# Patient Record
Sex: Female | Born: 1965 | Race: White | Hispanic: No | Smoking: Never smoker
Health system: Southern US, Community
[De-identification: ages and names within clinical notes are randomized; demographics above are authoritative.]

## PROBLEM LIST (undated history)

## (undated) DIAGNOSIS — I82629 Acute embolism and thrombosis of deep veins of unspecified upper extremity: Secondary | ICD-10-CM

## (undated) DIAGNOSIS — I10 Essential (primary) hypertension: Secondary | ICD-10-CM

## (undated) DIAGNOSIS — D219 Benign neoplasm of connective and other soft tissue, unspecified: Secondary | ICD-10-CM

## (undated) DIAGNOSIS — U071 COVID-19: Secondary | ICD-10-CM

## (undated) DIAGNOSIS — M1990 Unspecified osteoarthritis, unspecified site: Secondary | ICD-10-CM

## (undated) HISTORY — DX: Unspecified osteoarthritis, unspecified site: M19.90

## (undated) HISTORY — PX: MYOMECTOMY: SHX85

## (undated) HISTORY — DX: Benign neoplasm of connective and other soft tissue, unspecified: D21.9

---

## 2005-07-28 ENCOUNTER — Emergency Department: Payer: Self-pay | Admitting: Emergency Medicine

## 2005-09-10 ENCOUNTER — Emergency Department: Payer: Self-pay | Admitting: Unknown Physician Specialty

## 2006-03-21 ENCOUNTER — Emergency Department: Payer: Self-pay | Admitting: Emergency Medicine

## 2006-08-22 ENCOUNTER — Emergency Department: Payer: Self-pay | Admitting: Emergency Medicine

## 2006-09-22 ENCOUNTER — Emergency Department: Payer: Self-pay | Admitting: Emergency Medicine

## 2006-10-15 ENCOUNTER — Ambulatory Visit (HOSPITAL_COMMUNITY): Admission: RE | Admit: 2006-10-15 | Discharge: 2006-10-15 | Payer: Self-pay | Admitting: Specialist

## 2006-10-22 ENCOUNTER — Ambulatory Visit: Payer: Self-pay | Admitting: Pain Medicine

## 2006-11-01 ENCOUNTER — Ambulatory Visit: Payer: Self-pay | Admitting: Pain Medicine

## 2006-11-20 ENCOUNTER — Ambulatory Visit: Payer: Self-pay | Admitting: Physician Assistant

## 2006-12-04 ENCOUNTER — Ambulatory Visit: Payer: Self-pay | Admitting: Physician Assistant

## 2007-12-08 ENCOUNTER — Emergency Department: Payer: Self-pay | Admitting: Internal Medicine

## 2007-12-23 ENCOUNTER — Emergency Department: Payer: Self-pay | Admitting: Emergency Medicine

## 2008-01-29 ENCOUNTER — Emergency Department: Payer: Self-pay | Admitting: Internal Medicine

## 2008-02-02 ENCOUNTER — Emergency Department: Payer: Self-pay | Admitting: Emergency Medicine

## 2008-02-03 ENCOUNTER — Emergency Department: Payer: Self-pay | Admitting: Emergency Medicine

## 2008-02-07 ENCOUNTER — Emergency Department: Payer: Self-pay | Admitting: Emergency Medicine

## 2008-04-27 ENCOUNTER — Emergency Department: Payer: Self-pay | Admitting: Emergency Medicine

## 2008-05-06 ENCOUNTER — Emergency Department: Payer: Self-pay | Admitting: Unknown Physician Specialty

## 2008-08-21 ENCOUNTER — Emergency Department: Payer: Self-pay | Admitting: Emergency Medicine

## 2008-12-12 ENCOUNTER — Emergency Department: Payer: Self-pay | Admitting: Emergency Medicine

## 2008-12-14 ENCOUNTER — Emergency Department: Payer: Self-pay | Admitting: Emergency Medicine

## 2009-10-23 ENCOUNTER — Emergency Department: Payer: Self-pay | Admitting: Emergency Medicine

## 2010-01-05 ENCOUNTER — Emergency Department: Payer: Self-pay | Admitting: Emergency Medicine

## 2010-03-21 ENCOUNTER — Emergency Department: Payer: Self-pay | Admitting: Emergency Medicine

## 2010-08-13 IMAGING — CR DG ELBOW COMPLETE 3+V*L*
1 series · 4 of 4 positions shown · non-contrast
Comparison: none

REASON FOR EXAM: pain
COMMENTS:

PROCEDURE:     DXR - DXR ELBOW LT COMP W/OBLIQUES  - February 02, 2008 [DATE]
RESULT:     No acute bony or joint abnormality is identified.  Densities are
noted over the soft tissues about the olecranon.  These could represent
foreign bodies.  Soft tissue swelling is noted in this region.

[Series 1: view not recorded · 0.17mm/px · 4 of 4 slices shown]
[im 1/4]
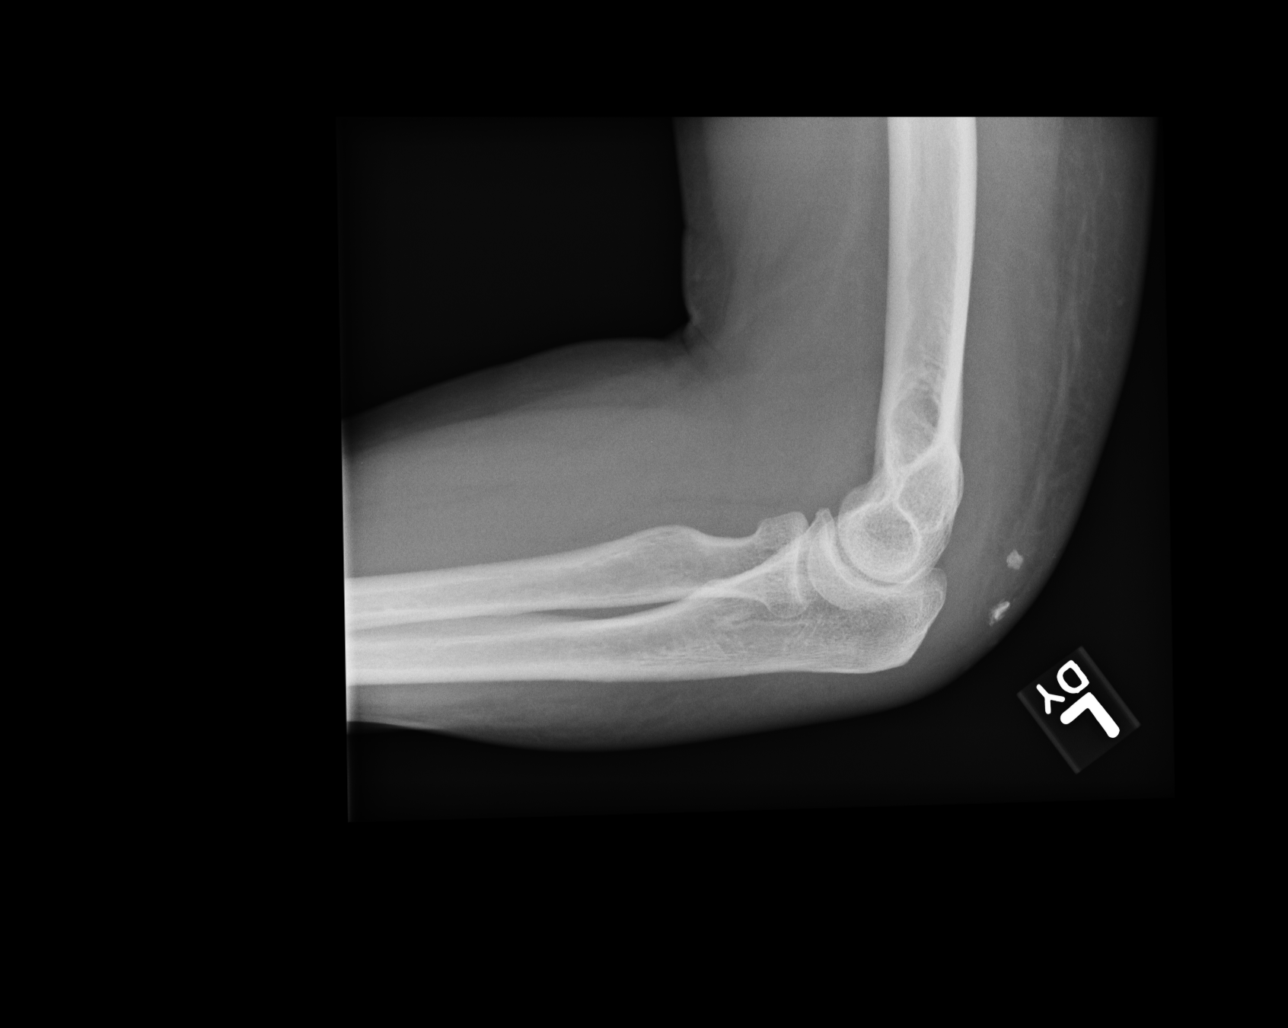
[im 2/4]
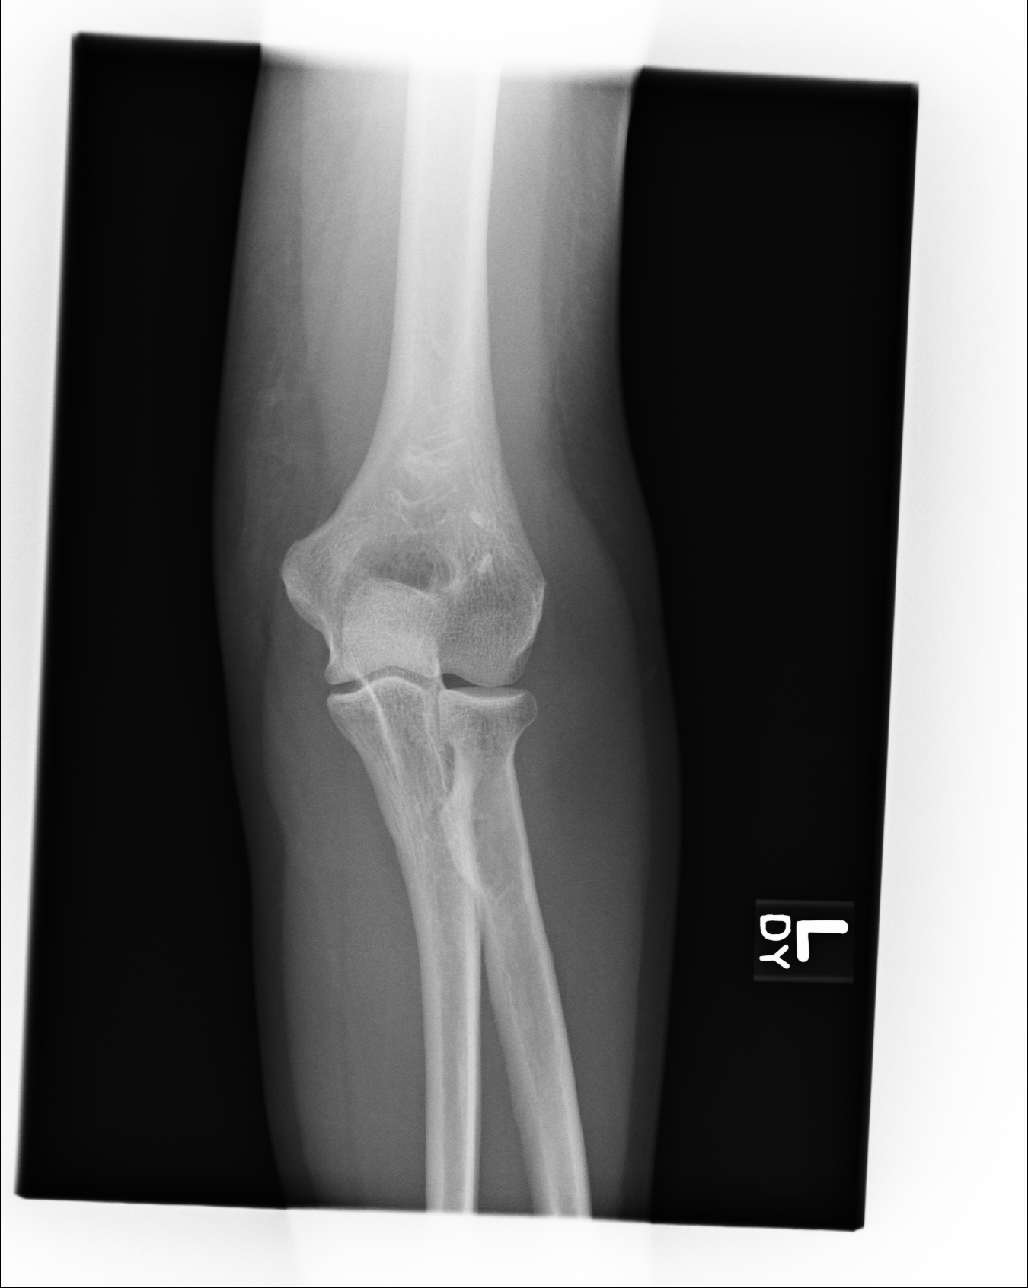
[im 3/4]
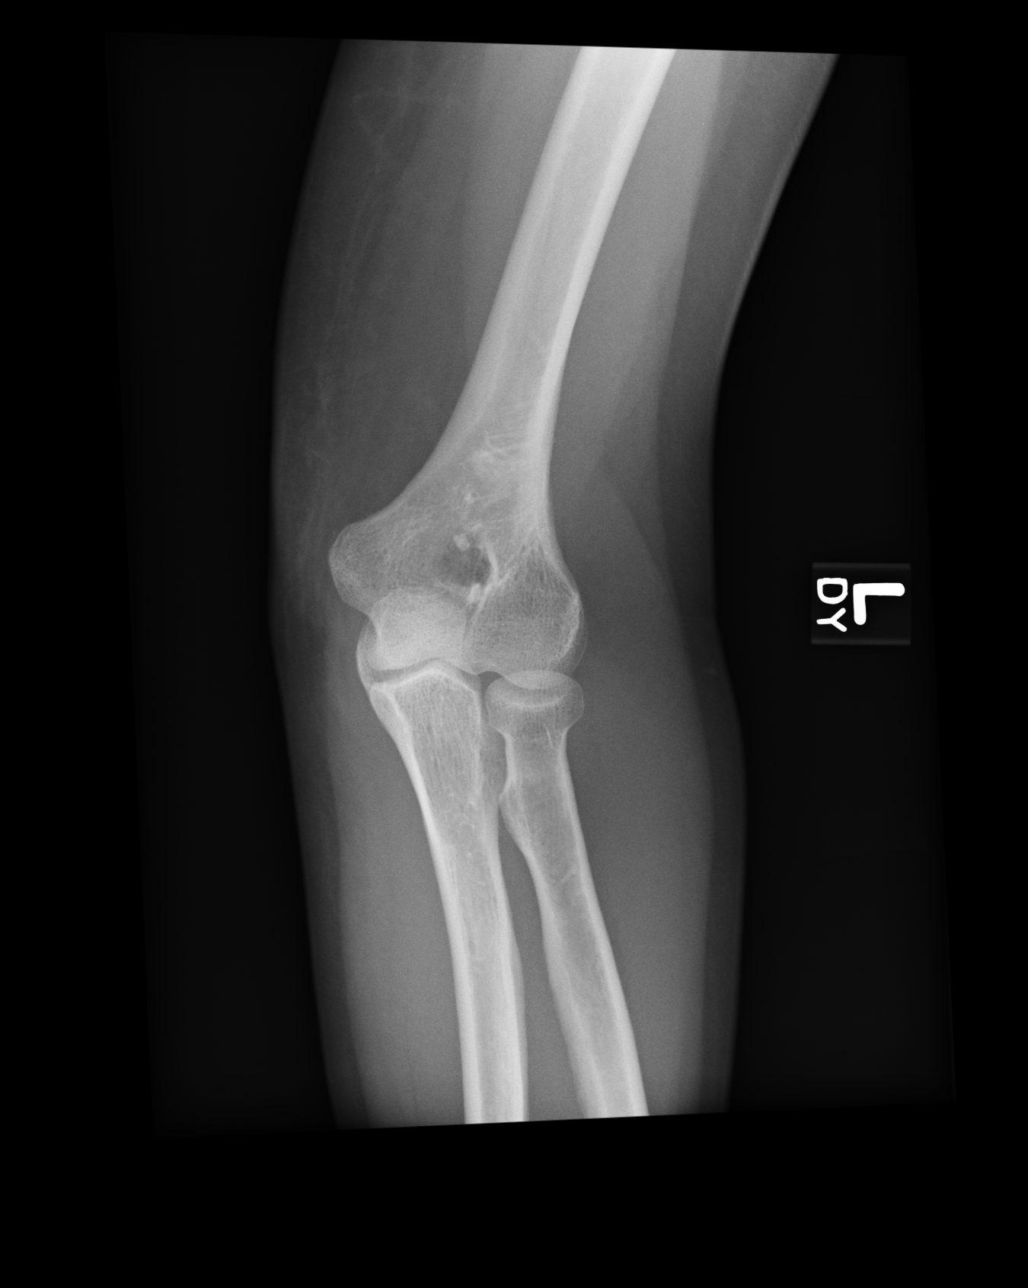
[im 4/4]
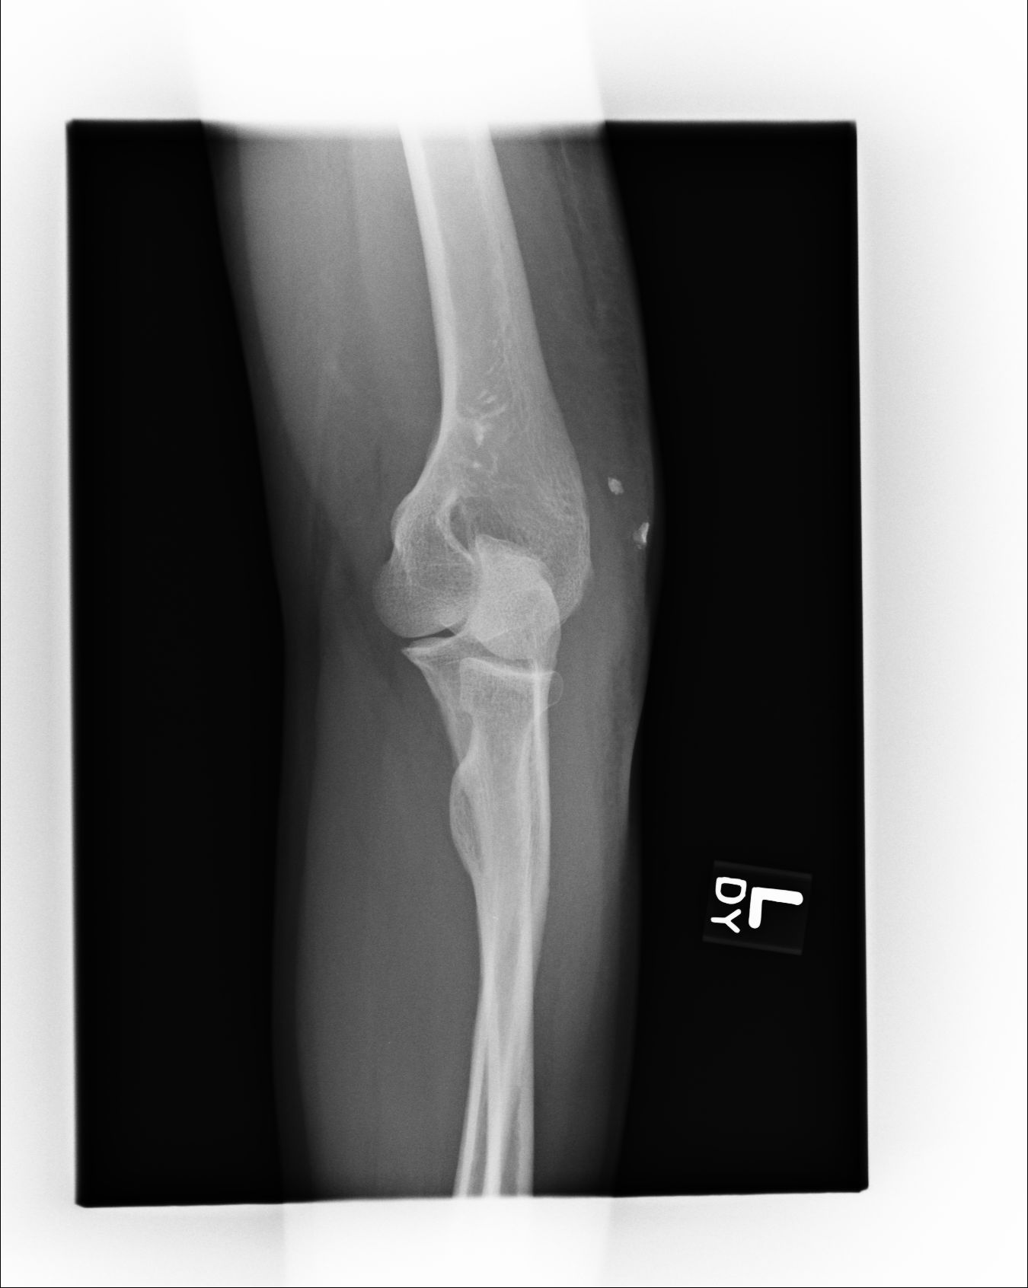

[4 of 4 positions shown; findings below may reference images not displayed]

IMPRESSION: Findings consistent with soft tissue swelling and foreign bodies over the
soft tissues of the olecranon.  No fracture is noted.

## 2010-08-23 ENCOUNTER — Emergency Department: Payer: Self-pay | Admitting: Emergency Medicine

## 2011-02-08 ENCOUNTER — Emergency Department: Payer: Self-pay | Admitting: Emergency Medicine

## 2011-07-07 ENCOUNTER — Emergency Department: Payer: Self-pay | Admitting: Emergency Medicine

## 2011-07-07 LAB — URINALYSIS, COMPLETE
Glucose,UR: NEGATIVE mg/dL (ref 0–75)
Ph: 6 (ref 4.5–8.0)
Protein: NEGATIVE
RBC,UR: 139 /HPF (ref 0–5)
Specific Gravity: 1.015 (ref 1.003–1.030)
Squamous Epithelial: 5
Transitional Epi: 1
WBC UR: 373 /HPF (ref 0–5)

## 2011-12-13 ENCOUNTER — Emergency Department: Payer: Self-pay | Admitting: Internal Medicine

## 2011-12-13 ENCOUNTER — Emergency Department: Payer: Self-pay | Admitting: Emergency Medicine

## 2011-12-13 LAB — CBC
HCT: 36 % (ref 35.0–47.0)
HGB: 11.7 g/dL — ABNORMAL LOW (ref 12.0–16.0)
MCH: 24.9 pg — ABNORMAL LOW (ref 26.0–34.0)
MCV: 76 fL — ABNORMAL LOW (ref 80–100)
Platelet: 419 10*3/uL (ref 150–440)

## 2011-12-13 LAB — COMPREHENSIVE METABOLIC PANEL
Albumin: 3.8 g/dL (ref 3.4–5.0)
Alkaline Phosphatase: 80 U/L (ref 50–136)
Anion Gap: 4 — ABNORMAL LOW (ref 7–16)
BUN: 14 mg/dL (ref 7–18)
Creatinine: 0.7 mg/dL (ref 0.60–1.30)
Glucose: 94 mg/dL (ref 65–99)
Osmolality: 280 (ref 275–301)
Potassium: 3.8 mmol/L (ref 3.5–5.1)
Sodium: 140 mmol/L (ref 136–145)
Total Protein: 6.9 g/dL (ref 6.4–8.2)

## 2011-12-13 LAB — URINALYSIS, COMPLETE
Glucose,UR: NEGATIVE mg/dL (ref 0–75)
Ketone: NEGATIVE
Nitrite: NEGATIVE
RBC,UR: 4 /HPF (ref 0–5)
Specific Gravity: 1.028 (ref 1.003–1.030)
WBC UR: 12 /HPF (ref 0–5)

## 2012-02-28 ENCOUNTER — Emergency Department: Payer: Self-pay | Admitting: Unknown Physician Specialty

## 2012-02-28 LAB — URINALYSIS, COMPLETE
Bilirubin,UR: NEGATIVE
Blood: NEGATIVE
Glucose,UR: NEGATIVE mg/dL (ref 0–75)
Ketone: NEGATIVE
Nitrite: NEGATIVE
WBC UR: 186 /HPF (ref 0–5)

## 2013-12-01 ENCOUNTER — Emergency Department: Payer: Self-pay | Admitting: Emergency Medicine

## 2014-03-30 ENCOUNTER — Emergency Department: Payer: Self-pay | Admitting: Emergency Medicine

## 2014-08-25 ENCOUNTER — Encounter: Payer: Self-pay | Admitting: Emergency Medicine

## 2014-08-25 ENCOUNTER — Emergency Department
Admission: EM | Admit: 2014-08-25 | Discharge: 2014-08-25 | Disposition: A | Discharge status: Home / Self Care | Payer: Self-pay | Attending: Emergency Medicine | Admitting: Emergency Medicine

## 2014-08-25 DIAGNOSIS — N390 Urinary tract infection, site not specified: Secondary | ICD-10-CM | POA: Insufficient documentation

## 2014-08-25 LAB — COMPREHENSIVE METABOLIC PANEL
ALT: 17 U/L (ref 14–54)
AST: 18 U/L (ref 15–41)
Albumin: 4.2 g/dL (ref 3.5–5.0)
Alkaline Phosphatase: 59 U/L (ref 38–126)
Anion gap: 10 (ref 5–15)
BUN: 16 mg/dL (ref 6–20)
CO2: 25 mmol/L (ref 22–32)
Calcium: 9.1 mg/dL (ref 8.9–10.3)
Chloride: 104 mmol/L (ref 101–111)
Creatinine, Ser: 0.79 mg/dL (ref 0.44–1.00)
GFR calc Af Amer: >60 mL/min (ref >60)
GFR calc non Af Amer: >60 mL/min (ref >60)
Glucose, Bld: 119 mg/dL — ABNORMAL HIGH (ref 65–99)
Potassium: 3.6 mmol/L (ref 3.5–5.1)
Sodium: 139 mmol/L (ref 135–145)
Total Bilirubin: 0.1 mg/dL — ABNORMAL LOW (ref 0.3–1.2)
Total Protein: 7.6 g/dL (ref 6.5–8.1)

## 2014-08-25 LAB — CBC
HCT: 40 % (ref 35.0–47.0)
HEMOGLOBIN: 12.5 g/dL (ref 12.0–16.0)
MCH: 23.8 pg — AB (ref 26.0–34.0)
MCHC: 31.2 g/dL — AB (ref 32.0–36.0)
MCV: 76.3 fL — ABNORMAL LOW (ref 80.0–100.0)
PLATELETS: 459 10*3/uL — AB (ref 150–440)
RBC: 5.24 MIL/uL — ABNORMAL HIGH (ref 3.80–5.20)
RDW: 15.2 % — AB (ref 11.5–14.5)
WBC: 10.1 10*3/uL (ref 3.6–11.0)

## 2014-08-25 LAB — URINALYSIS COMPLETE WITH MICROSCOPIC (ARMC ONLY)
Bilirubin Urine: NEGATIVE
Glucose, UA: NEGATIVE mg/dL
Hgb urine dipstick: NEGATIVE
Ketones, ur: NEGATIVE mg/dL
Nitrite: NEGATIVE
Protein, ur: 30 mg/dL — AB
Specific Gravity, Urine: 1.025 (ref 1.005–1.030)
pH: 5 (ref 5.0–8.0)

## 2014-08-25 LAB — LIPASE, BLOOD: Lipase: 26 U/L (ref 22–51)

## 2014-08-25 MED ORDER — ONDANSETRON 4 MG PO TBDP: 4.0000 mg | ORAL_TABLET | Freq: Four times a day (QID) | ORAL | Status: DC | PRN

## 2014-08-25 MED ORDER — IBUPROFEN 800 MG PO TABS
800.0000 mg | ORAL_TABLET | ORAL | Status: AC
  Administered 2014-08-25: 800 mg via ORAL
  Filled 2014-08-25: qty 1

## 2014-08-25 MED ORDER — ONDANSETRON HCL 4 MG/2ML IJ SOLN
4.0000 mg | INTRAMUSCULAR | Status: AC
  Administered 2014-08-25: 4 mg via INTRAVENOUS
  Filled 2014-08-25: qty 2

## 2014-08-25 MED ORDER — CEPHALEXIN 500 MG PO CAPS: 500.0000 mg | ORAL_CAPSULE | Freq: Two times a day (BID) | ORAL | Status: DC

## 2014-08-25 MED ORDER — SODIUM CHLORIDE 0.9 % IV BOLUS (SEPSIS)
1000.0000 mL | Freq: Once | INTRAVENOUS | Status: AC
  Administered 2014-08-25: 1000 mL via INTRAVENOUS

## 2014-08-25 MED ORDER — DEXTROSE 5 % IV SOLN
1.0000 g | Freq: Once | INTRAVENOUS | Status: AC
  Administered 2014-08-25: 1 g via INTRAVENOUS
  Filled 2014-08-25: qty 10

## 2014-08-25 NOTE — ED Provider Notes (Signed)
[facility] Emergency Department Provider Note  ____________________________________________  Time seen: Approximately 9:52 AM  I have reviewed the triage vital signs and the nursing notes.   HISTORY  Chief Complaint Abdominal Pain and Nausea    HPI Christine Chase is a 49 y.o. female who is fairly healthy, but reports that she's had some nausea, some chills, and abdominal pain which she describes as a crampy pain and throwing up twice yesterday. She reports that for the last week she's felt generally just tired and slightly weak.Occasional headaches off and on for about a week as well.  She states she is able to eat breakfast this morning, her abdominal pain has gone away but she still feels slightly nauseated and generally fatigued and tired. Slight increase in frequency of urination, and she reports she has had a couple of urinary tract infections in the last year most recently treated with ciprofloxacin roughly 6 months ago.   History reviewed. No pertinent past medical history.  There are no active problems to display for this patient.   History reviewed. No pertinent past surgical history.  Current Outpatient Rx  Name  Route  Sig  Dispense  Refill  . cephALEXin (KEFLEX) 500 MG capsule   Oral   Take 1 capsule (500 mg total) by mouth 2 (two) times daily.   20 capsule   0   . ondansetron (ZOFRAN ODT) 4 MG disintegrating tablet   Oral   Take 1 tablet (4 mg total) by mouth every 6 (six) hours as needed for nausea or vomiting.   20 tablet   0     Allergies Review of patient's allergies indicates no known allergies.  History reviewed. No pertinent family history.  Social History Social History  Substance Use Topics  . Smoking status: Never Smoker   . Smokeless tobacco: None  . Alcohol Use: No    Review of Systems Constitutional: No fever, some chills Eyes: No visual changes. ENT: No sore throat. Cardiovascular: Denies chest  pain. Respiratory: Denies shortness of breath. Gastrointestinal: See history of present illness  No diarrhea.  No constipation. Genitourinary: Negative for dysuria. Some frequency. Musculoskeletal: Negative for back pain. Skin: Negative for rash. Neurological: Negative for  focal weakness or numbness. No tick bites. No rash.  10-point ROS otherwise negative.  ____________________________________________   PHYSICAL EXAM:  VITAL SIGNS: ED Triage Vitals  Enc Vitals Group     BP 08/25/14 0800 163/94 mmHg     Pulse Rate 08/25/14 0800 96     Resp 08/25/14 0800 20     Temp 08/25/14 0800 97.7 F (36.5 C)     Temp Source 08/25/14 0800 Oral     SpO2 08/25/14 0800 100 %     Weight 08/25/14 0800 165 lb (74.844 kg)     Height 08/25/14 0800 5\' 5"  (1.651 m)     Head Cir --      Peak Flow --      Pain Score 08/25/14 0801 6     Pain Loc --      Pain Edu? --      Excl. in [place]? --     Constitutional: Alert and oriented. Well appearing and in no acute distress. Very pleasant and amicable. Eyes: Conjunctivae are normal. PERRL. EOMI. Head: Atraumatic. Nose: No congestion/rhinnorhea. Mouth/Throat: Mucous membranes are moist.  Oropharynx non-erythematous. Neck: No stridor.   Cardiovascular: Normal rate, regular rhythm. Grossly normal heart sounds.  Good peripheral circulation. Respiratory: Normal respiratory effort.  No retractions.  Lungs CTAB. Gastrointestinal: Soft and nontender except for slight discomfort in the suprapubic region. No distention. No abdominal bruits. No CVA tenderness. No pain at McBurney's point. No rebound or guarding. Musculoskeletal: No lower extremity tenderness nor edema.  No joint effusions. Neurologic:  Normal speech and language. No gross focal neurologic deficits are appreciated. Skin:  Skin is warm, dry and intact. No rash noted. Psychiatric: Mood and affect are normal. Speech and behavior are normal.  ____________________________________________   LABS (all  labs ordered are listed, but only abnormal results are displayed)  Labs Reviewed  COMPREHENSIVE METABOLIC PANEL - Abnormal; Notable for the following:    Glucose, Bld 119 (*)    Total Bilirubin 0.1 (*)    All other components within normal limits  CBC - Abnormal; Notable for the following:    RBC 5.24 (*)    MCV 76.3 (*)    MCH 23.8 (*)    MCHC 31.2 (*)    RDW 15.2 (*)    Platelets 459 (*)    All other components within normal limits  URINALYSIS COMPLETEWITH MICROSCOPIC ([facility] ONLY) - Abnormal; Notable for the following:    Color, Urine YELLOW (*)    APPearance CLOUDY (*)    Protein, ur 30 (*)    Leukocytes, UA 3+ (*)    Bacteria, UA MANY (*)    Squamous Epithelial / LPF 6-30 (*)    All other components within normal limits  URINE CULTURE  LIPASE, BLOOD   ____________________________________________  EKG   ____________________________________________  RADIOLOGY   ____________________________________________   PROCEDURES  Procedure(s) performed: None  Critical Care performed: No  ____________________________________________   INITIAL IMPRESSION / ASSESSMENT AND PLAN / ED COURSE  Pertinent labs & imaging results that were available during my care of the patient were reviewed by me and considered in my medical decision making (see chart for details).  And symptoms of generalized illness, with some slight frequency and a urinalysis that is highly suggestive of UTI. Very reassuring exam. Afebrile with a normal white count. No risk factors for systemic or severe infection. Appears consistent with urinary tract infection, I will hydrate her and initiated her with ceftriaxone and continue her on Keflex for the next week. She has primary care follow-up R reestablish for Tuesday of this week, and she will follow up then.   Return precautions including any right lower abdominal pain, fever after 24 hours of antibiotic's, worsening symptoms, inability to keep food down,  feeling dehydrated, severe pain or other concerns discussed with the patient who is agreeable. ____________________________________________   FINAL CLINICAL IMPRESSION(S) / ED DIAGNOSES  Final diagnoses:  Acute urinary tract infection      Delman Kitten, MD 08/28/14 [phone]

## 2014-08-25 NOTE — Discharge Instructions (Signed)
You have been seen in the Emergency Department (ED) today for pain when urinating.  Your workup today suggests that you have a urinary tract infection (UTI).  Please take your antibiotic as prescribed and over-the-counter pain medication (Tylenol or Motrin) as needed, but no more than recommended on the label instructions.  Drink PLENTY of fluids.  Call your regular doctor to schedule the next available appointment to follow up on todays ED visit, or return immediately to the ED if your pain worsens, you have decreased urine production, develop fever, persistent vomiting, or other symptoms that concern you.   Urinary Tract Infection Urinary tract infections (UTIs) can develop anywhere along your urinary tract. Your urinary tract is your body's drainage system for removing wastes and extra water. Your urinary tract includes two kidneys, two ureters, a bladder, and a urethra. Your kidneys are a pair of bean-shaped organs. Each kidney is about the size of your fist. They are located below your ribs, one on each side of your spine. CAUSES Infections are caused by microbes, which are microscopic organisms, including fungi, viruses, and bacteria. These organisms are so small that they can only be seen through a microscope. Bacteria are the microbes that most commonly cause UTIs. SYMPTOMS  Symptoms of UTIs may vary by age and gender of the patient and by the location of the infection. Symptoms in young women typically include a frequent and intense urge to urinate and a painful, burning feeling in the bladder or urethra during urination. Older women and men are more likely to be tired, shaky, and weak and have muscle aches and abdominal pain. A fever may mean the infection is in your kidneys. Other symptoms of a kidney infection include pain in your back or sides below the ribs, nausea, and vomiting. DIAGNOSIS To diagnose a UTI, your caregiver will ask you about your symptoms. Your caregiver also will ask to  provide a urine sample. The urine sample will be tested for bacteria and white blood cells. White blood cells are made by your body to help fight infection. TREATMENT  Typically, UTIs can be treated with medication. Because most UTIs are caused by a bacterial infection, they usually can be treated with the use of antibiotics. The choice of antibiotic and length of treatment depend on your symptoms and the type of bacteria causing your infection. HOME CARE INSTRUCTIONS  If you were prescribed antibiotics, take them exactly as your caregiver instructs you. Finish the medication even if you feel better after you have only taken some of the medication.  Drink enough water and fluids to keep your urine clear or pale yellow.  Avoid caffeine, tea, and carbonated beverages. They tend to irritate your bladder.  Empty your bladder often. Avoid holding urine for long periods of time.  Empty your bladder before and after sexual intercourse.  After a bowel movement, women should cleanse from front to back. Use each tissue only once. SEEK MEDICAL CARE IF:   You have back pain.  You develop a fever.  Your symptoms do not begin to resolve within 3 days. SEEK IMMEDIATE MEDICAL CARE IF:   You have severe back pain or lower abdominal pain.  You develop chills.  You have nausea or vomiting.  You have continued burning or discomfort with urination. MAKE SURE YOU:   Understand these instructions.  Will watch your condition.  Will get help right away if you are not doing well or get worse. Document Released: 10/05/2004 Document Revised: 06/27/2011 Document Reviewed: 02/03/2011  [employer]® Patient Information ©2015 [employer]. This information is not intended to replace advice given to you by your health care provider. Make sure you discuss any questions you have with your health care provider. ° °

## 2014-08-25 NOTE — ED Notes (Signed)
Pt to ed with c/o vomiting and chills, and abd pain that started last night.  Pt denies diarrhea.

## 2014-08-27 ENCOUNTER — Telehealth: Payer: Self-pay | Admitting: Emergency Medicine

## 2014-08-27 LAB — URINE CULTURE: Special Requests: NORMAL

## 2014-08-28 NOTE — ED Notes (Signed)
Spoke to pt yesterday andurine culture was not back yet.  Today it says recollection is suggested because it has multiple organisms.  I called teh patient back to inform her of this and to follow up with her pcp, but she did not answer . i left a message on her voicemail with my phone number.

## 2014-11-10 ENCOUNTER — Encounter: Payer: Self-pay | Admitting: Emergency Medicine

## 2014-11-10 ENCOUNTER — Emergency Department
Admission: EM | Admit: 2014-11-10 | Discharge: 2014-11-10 | Disposition: A | Discharge status: Home / Self Care | Payer: Self-pay | Attending: Emergency Medicine | Admitting: Emergency Medicine

## 2014-11-10 DIAGNOSIS — J029 Acute pharyngitis, unspecified: Secondary | ICD-10-CM | POA: Insufficient documentation

## 2014-11-10 DIAGNOSIS — R519 Headache, unspecified: Secondary | ICD-10-CM

## 2014-11-10 DIAGNOSIS — R51 Headache: Secondary | ICD-10-CM

## 2014-11-10 DIAGNOSIS — R0982 Postnasal drip: Secondary | ICD-10-CM | POA: Insufficient documentation

## 2014-11-10 DIAGNOSIS — Z792 Long term (current) use of antibiotics: Secondary | ICD-10-CM | POA: Insufficient documentation

## 2014-11-10 MED ORDER — DEXAMETHASONE SODIUM PHOSPHATE 10 MG/ML IJ SOLN
10.0000 mg | Freq: Once | INTRAMUSCULAR | Status: DC
  Filled 2014-11-10: qty 1

## 2014-11-10 MED ORDER — BUTALBITAL-APAP-CAFFEINE 50-325-40 MG PO TABS
2.0000 | ORAL_TABLET | Freq: Once | ORAL | Status: AC
  Administered 2014-11-10: 2 via ORAL
  Filled 2014-11-10: qty 2

## 2014-11-10 MED ORDER — DEXAMETHASONE 10 MG/ML FOR PEDIATRIC ORAL USE
10.0000 mg | Freq: Once | INTRAMUSCULAR | Status: AC
  Administered 2014-11-10: 10 mg via ORAL

## 2014-11-10 MED ORDER — BUTALBITAL-APAP-CAFFEINE 50-325-40 MG PO TABS: 1.0000 | ORAL_TABLET | Freq: Four times a day (QID) | ORAL | Status: AC | PRN

## 2014-11-10 MED ORDER — IBUPROFEN 600 MG PO TABS
600.0000 mg | ORAL_TABLET | Freq: Once | ORAL | Status: AC
  Administered 2014-11-10: 600 mg via ORAL
  Filled 2014-11-10: qty 1

## 2014-11-10 NOTE — ED Provider Notes (Signed)
[facility] Emergency Department Provider Note  ____________________________________________  Time seen: Approximately 7:25 AM  I have reviewed the triage vital signs and the nursing notes.   HISTORY  Chief Complaint Sore Throat    HPI Christine Chase is a 49 y.o. female who comes into the hospital today with a sore throat. She reports that she's had it for the last 2 days. 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 The patient felt feverish yesterday but did not take her temperature. She has been taking Goody powders for the pain but has not been helping. The patient reports she has 8 out of 10 pain in her throat and a runny nose. The patient also reports that she's had a headache that over her right eye. The patient does have a history of migraines and reports that this headache feels like a migraine. The patient is having some pain with swallowing. She is not having any cough or any blurry vision any chest pain or shortness of breath. The patient also is not having any nausea or vomiting.  Past medical history Migraine headaches  There are no active problems to display for this patient.   Past surgical history Myomectomy  Current Outpatient Rx  Name  Route  Sig  Dispense  Refill  . butalbital-acetaminophen-caffeine (FIORICET) 50-325-40 MG tablet   Oral   Take 1-2 tablets by mouth every 6 (six) hours as needed for headache.   20 tablet   0   . cephALEXin (KEFLEX) 500 MG capsule   Oral   Take 1 capsule (500 mg total) by mouth 2 (two) times daily.   20 capsule   0   . ondansetron (ZOFRAN ODT) 4 MG disintegrating tablet   Oral   Take 1 tablet (4 mg total) by mouth every 6 (six) hours as needed for nausea or vomiting.   20 tablet   0     Allergies Review of patient's allergies indicates no known allergies.  History reviewed. No pertinent family history.  Social  History Social History  Substance Use Topics  . Smoking status: Never Smoker   . Smokeless tobacco: None  . Alcohol Use: No    Review of Systems Constitutional: No fever/chills Eyes: No visual changes. ENT:  sore throat, nasal congestion Cardiovascular: Denies chest pain. Respiratory: Denies shortness of breath. Gastrointestinal: No abdominal pain.  No nausea, no vomiting.  No diarrhea.  No constipation. Genitourinary: Negative for dysuria. Musculoskeletal: Negative for back pain. Skin: Negative for rash. Neurological: Headache  10-point ROS otherwise negative.  ____________________________________________   PHYSICAL EXAM:  VITAL SIGNS: ED Triage Vitals  Enc Vitals Group     BP 11/10/14 0636 174/98 mmHg     Pulse Rate 11/10/14 0636 80     Resp 11/10/14 0636 18     Temp 11/10/14 0636 97.5 F (36.4 C)     Temp Source 11/10/14 0636 Oral     SpO2 11/10/14 0636 97 %     Weight 11/10/14 0636 165 lb (74.844 kg)     Height 11/10/14 0636 5\' 5"  (1.651 m)     Head Cir --      Peak Flow --      Pain Score 11/10/14 0637 8     Pain Loc --      Pain Edu? --      Excl. in [place]? --     Constitutional: Alert  and oriented. Well appearing and in mild distress. Eyes: Conjunctivae are normal. PERRL. EOMI. Head: Atraumatic. Nose: No congestion/rhinnorhea. Mouth/Throat: Mucous membranes are moist.  Oropharynx non-erythematous. Hematological/Lymphatic/Immunilogical: No cervical lymphadenopathy. Cardiovascular: Normal rate, regular rhythm. Grossly normal heart sounds.  Good peripheral circulation. Respiratory: Normal respiratory effort.  No retractions. Lungs CTAB. Gastrointestinal: Soft and nontender. No distention. Positive bowel sounds Musculoskeletal: No lower extremity tenderness nor edema.   Neurologic:  Normal speech and language. No gross focal neurologic deficits are appreciated.  Skin:  Skin is warm, dry and intact.  Psychiatric: Mood and affect are normal.    ____________________________________________   LABS (all labs ordered are listed, but only abnormal results are displayed)  Labs Reviewed  CULTURE, GROUP A STREP ([facility] ONLY)   ____________________________________________  EKG  None ____________________________________________  RADIOLOGY  None ____________________________________________   PROCEDURES  Procedure(s) performed: None  Critical Care performed: No  ____________________________________________   INITIAL IMPRESSION / ASSESSMENT AND PLAN / ED COURSE  Pertinent labs & imaging results that were available during my care of the patient were reviewed by me and considered in my medical decision making (see chart for details).  This is a 49 year old female who comes in today with some sore throat for the last 2 days. The patient's throat is nonerythematous has no petechiae or plaque and she does not have any large swollen lymph nodes. The patient's strep test is negative but we will send a culture for further evaluation. I did give the patient a dose of Decadron, ibuprofen and some fears of her headache. I will discharge the patient to home and have her follow back up with her primary care physician for further evaluation. ____________________________________________   FINAL CLINICAL IMPRESSION(S) / ED DIAGNOSES  Final diagnoses:  Pharyngitis  Post-nasal drip  Acute nonintractable headache, unspecified headache type      Loney Hering, MD 11/10/14 [phone]

## 2014-11-10 NOTE — ED Notes (Signed)
pt reports sore throat started on sunday painful to swallow

## 2014-11-10 NOTE — Discharge Instructions (Signed)
Migraine Headache A migraine headache is an intense, throbbing pain on one or both sides of your head. A migraine can last for 30 minutes to several hours. CAUSES  The exact cause of a migraine headache is not always known. However, a migraine may be caused when nerves in the brain become irritated and release chemicals that cause inflammation. This causes pain. Certain things may also trigger migraines, such as:  Alcohol.  Smoking.  Stress.  Menstruation.  Aged cheeses.  Foods or drinks that contain nitrates, glutamate, aspartame, or tyramine.  Lack of sleep.  Chocolate.  Caffeine.  Hunger.  Physical exertion.  Fatigue.  Medicines used to treat chest pain (nitroglycerine), birth control pills, estrogen, and some blood pressure medicines. SIGNS AND SYMPTOMS  Pain on one or both sides of your head.  Pulsating or throbbing pain.  Severe pain that prevents daily activities.  Pain that is aggravated by any physical activity.  Nausea, vomiting, or both.  Dizziness.  Pain with exposure to bright lights, loud noises, or activity.  General sensitivity to bright lights, loud noises, or smells. Before you get a migraine, you may get warning signs that a migraine is coming (aura). An aura may include:  Seeing flashing lights.  Seeing bright spots, halos, or zigzag lines.  Having tunnel vision or blurred vision.  Having feelings of numbness or tingling.  Having trouble talking.  Having muscle weakness. DIAGNOSIS  A migraine headache is often diagnosed based on:  Symptoms.  Physical exam.  A CT scan or MRI of your head. These imaging tests cannot diagnose migraines, but they can help rule out other causes of headaches. TREATMENT Medicines may be given for pain and nausea. Medicines can also be given to help prevent recurrent migraines.  HOME CARE INSTRUCTIONS  Only take over-the-counter or prescription medicines for pain or discomfort as directed by your  health care provider. The use of long-term narcotics is not recommended.  Lie down in a dark, quiet room when you have a migraine.  Keep a journal to find out what may trigger your migraine headaches. For example, write down:  What you eat and drink.  How much sleep you get.  Any change to your diet or medicines.  Limit alcohol consumption.  Quit smoking if you smoke.  Get 7-9 hours of sleep, or as recommended by your health care provider.  Limit stress.  Keep lights dim if bright lights bother you and make your migraines worse. SEEK IMMEDIATE MEDICAL CARE IF:   Your migraine becomes severe.  You have a fever.  You have a stiff neck.  You have vision loss.  You have muscular weakness or loss of muscle control.  You start losing your balance or have trouble walking.  You feel faint or pass out.  You have severe symptoms that are different from your first symptoms. MAKE SURE YOU:   Understand these instructions.  Will watch your condition.  Will get help right away if you are not doing well or get worse.   This information is not intended to replace advice given to you by your health care provider. Make sure you discuss any questions you have with your health care provider.   Document Released: 12/26/2004 Document Revised: 01/16/2014 Document Reviewed: 09/02/2012 Elsevier Interactive Patient Education 2016 [employer] Inc[employer]  Pharyngitis Pharyngitis is redness, pain, and swelling (inflammation) of your pharynx.  CAUSES  Pharyngitis is usually caused by infection. Most of the time, these infections are from viruses (viral) and are part of  a cold. However, sometimes pharyngitis is caused by bacteria (bacterial). Pharyngitis can also be caused by allergies. Viral pharyngitis may be spread from person to person by coughing, sneezing, and personal items or utensils (cups, forks, spoons, toothbrushes). Bacterial pharyngitis may be spread from person to person by more  intimate contact, such as kissing.  SIGNS AND SYMPTOMS  Symptoms of pharyngitis include:   Sore throat.   Tiredness (fatigue).   Low-grade fever.   Headache.  Joint pain and muscle aches.  Skin rashes.  Swollen lymph nodes.  Plaque-like film on throat or tonsils (often seen with bacterial pharyngitis). DIAGNOSIS  Your health care provider will ask you questions about your illness and your symptoms. Your medical history, along with a physical exam, is often all that is needed to diagnose pharyngitis. Sometimes, a rapid strep test is done. Other lab tests may also be done, depending on the suspected cause.  TREATMENT  Viral pharyngitis will usually get better in 3-4 days without the use of medicine. Bacterial pharyngitis is treated with medicines that kill germs (antibiotics).  HOME CARE INSTRUCTIONS   Drink enough water and fluids to keep your urine clear or pale yellow.   Only take over-the-counter or prescription medicines as directed by your health care provider:   If you are prescribed antibiotics, make sure you finish them even if you start to feel better.   Do not take aspirin.   Get lots of rest.   Gargle with 8 oz of salt water ( tsp of salt per 1 qt of water) as often as every 1-2 hours to soothe your throat.   Throat lozenges (if you are not at risk for choking) or sprays may be used to soothe your throat. SEEK MEDICAL CARE IF:   You have large, tender lumps in your neck.  You have a rash.  You cough up green, yellow-brown, or bloody spit. SEEK IMMEDIATE MEDICAL CARE IF:   Your neck becomes stiff.  You drool or are unable to swallow liquids.  You vomit or are unable to keep medicines or liquids down.  You have severe pain that does not go away with the use of recommended medicines.  You have trouble breathing (not caused by a stuffy nose). MAKE SURE YOU:   Understand these instructions.  Will watch your condition.  Will get help right  away if you are not doing well or get worse.   This information is not intended to replace advice given to you by your health care provider. Make sure you discuss any questions you have with your health care provider.   Document Released: 12/26/2004 Document Revised: 10/16/2012 Document Reviewed: 09/02/2012 Elsevier Interactive Patient Education 2016 Else[employer] Inc.  Upper Respiratory Infection, Adult Most upper respiratory infections (URIs) are a viral infection of the air passages leading to the lungs. A URI affects the nose, throat, and upper air passages. The most common type of URI is nasopharyngitis and is typically referred to as "the common cold." URIs run their course and usually go away on their own. Most of the time, a URI does not require medical attention, but sometimes a bacterial infection in the upper airways can follow a viral infection. This is called a secondary infection. Sinus and middle ear infections are common types of secondary upper respiratory infections. Bacterial pneumonia can also complicate a URI. A URI can worsen asthma and chronic obstructive pulmonary disease (COPD). Sometimes, these complications can require emergency medical care and may be life threatening.  CAUSES Almost  all URIs are caused by viruses. A virus is a type of germ and can spread from one person to another.  RISKS FACTORS You may be at risk for a URI if:   You smoke.   You have chronic heart or lung disease.  You have a weakened defense (immune) system.   You are very young or very old.   You have nasal allergies or asthma.  You work in crowded or poorly ventilated areas.  You work in health care facilities or schools. SIGNS AND SYMPTOMS  Symptoms typically develop 2-3 days after you come in contact with a cold virus. Most viral URIs last 7-10 days. However, viral URIs from the influenza virus (flu virus) can last 14-18 days and are typically more severe. Symptoms may include:    Runny or stuffy (congested) nose.   Sneezing.   Cough.   Sore throat.   Headache.   Fatigue.   Fever.   Loss of appetite.   Pain in your forehead, behind your eyes, and over your cheekbones (sinus pain).  Muscle aches.  DIAGNOSIS  Your health care provider may diagnose a URI by:  Physical exam.  Tests to check that your symptoms are not due to another condition such as:  Strep throat.  Sinusitis.  Pneumonia.  Asthma. TREATMENT  A URI goes away on its own with time. It cannot be cured with medicines, but medicines may be prescribed or recommended to relieve symptoms. Medicines may help:  Reduce your fever.  Reduce your cough.  Relieve nasal congestion. HOME CARE INSTRUCTIONS   Take medicines only as directed by your health care provider.   Gargle warm saltwater or take cough drops to comfort your throat as directed by your health care provider.  Use a warm mist humidifier or inhale steam from a shower to increase air moisture. This may make it easier to breathe.  Drink enough fluid to keep your urine clear or pale yellow.   Eat soups and other clear broths and maintain good nutrition.   Rest as needed.   Return to work when your temperature has returned to normal or as your health care provider advises. You may need to stay home longer to avoid infecting others. You can also use a face mask and careful hand washing to prevent spread of the virus.  Increase the usage of your inhaler if you have asthma.   Do not use any tobacco products, including cigarettes, chewing tobacco, or electronic cigarettes. If you need help quitting, ask your health care provider. PREVENTION  The best way to protect yourself from getting a cold is to practice good hygiene.   Avoid oral or hand contact with people with cold symptoms.   Wash your hands often if contact occurs.  There is no clear evidence that vitamin C, vitamin E, echinacea, or exercise  reduces the chance of developing a cold. However, it is always recommended to get plenty of rest, exercise, and practice good nutrition.  SEEK MEDICAL CARE IF:   You are getting worse rather than better.   Your symptoms are not controlled by medicine.   You have chills.  You have worsening shortness of breath.  You have brown or red mucus.  You have yellow or brown nasal discharge.  You have pain in your face, especially when you bend forward.  You have a fever.  You have swollen neck glands.  You have pain while swallowing.  You have white areas in the back of your throat.  SEEK IMMEDIATE MEDICAL CARE IF:   You have severe or persistent:  Headache.  Ear pain.  Sinus pain.  Chest pain.  You have chronic lung disease and any of the following:  Wheezing.  Prolonged cough.  Coughing up blood.  A change in your usual mucus.  You have a stiff neck.  You have changes in your:  Vision.  Hearing.  Thinking.  Mood. MAKE SURE YOU:   Understand these instructions.  Will watch your condition.  Will get help right away if you are not doing well or get worse.   This information is not intended to replace advice given to you by your health care provider. Make sure you discuss any questions you have with your health care provider.   Document Released: 06/21/2000 Document Revised: 05/12/2014 Document Reviewed: 04/02/2013 Elsevier Interactive Patient Education [employer].

## 2014-11-12 LAB — CULTURE, GROUP A STREP (THRC)

## 2014-11-13 LAB — POCT RAPID STREP A: Streptococcus, Group A Screen (Direct): NEGATIVE

## 2015-05-26 ENCOUNTER — Emergency Department
Admission: EM | Admit: 2015-05-26 | Discharge: 2015-05-26 | Disposition: A | Discharge status: Home / Self Care | Payer: BLUE CROSS/BLUE SHIELD | Attending: Emergency Medicine | Admitting: Emergency Medicine

## 2015-05-26 ENCOUNTER — Encounter: Payer: Self-pay | Admitting: Emergency Medicine

## 2015-05-26 DIAGNOSIS — I10 Essential (primary) hypertension: Secondary | ICD-10-CM | POA: Diagnosis not present

## 2015-05-26 DIAGNOSIS — Z79899 Other long term (current) drug therapy: Secondary | ICD-10-CM | POA: Insufficient documentation

## 2015-05-26 DIAGNOSIS — M791 Myalgia: Secondary | ICD-10-CM | POA: Diagnosis present

## 2015-05-26 DIAGNOSIS — R05 Cough: Secondary | ICD-10-CM | POA: Diagnosis not present

## 2015-05-26 DIAGNOSIS — T464X5A Adverse effect of angiotensin-converting-enzyme inhibitors, initial encounter: Secondary | ICD-10-CM | POA: Insufficient documentation

## 2015-05-26 HISTORY — DX: Essential (primary) hypertension: I10

## 2015-05-26 MED ORDER — BENZONATATE 100 MG PO CAPS: 200.0000 mg | ORAL_CAPSULE | Freq: Three times a day (TID) | ORAL | Status: DC | PRN

## 2015-05-26 MED ORDER — KETOROLAC TROMETHAMINE 60 MG/2ML IM SOLN
30.0000 mg | Freq: Once | INTRAMUSCULAR | Status: DC
  Filled 2015-05-26: qty 2

## 2015-05-26 MED ORDER — BENZONATATE 100 MG PO CAPS
200.0000 mg | ORAL_CAPSULE | Freq: Once | ORAL | Status: AC
  Administered 2015-05-26: 200 mg via ORAL
  Filled 2015-05-26: qty 2

## 2015-05-26 NOTE — ED Provider Notes (Signed)
[facility] Emergency Department Provider Note   ____________________________________________  Time seen: Approximately 7:21 AM  I have reviewed the triage vital signs and the nursing notes.   HISTORY  Chief Complaint Generalized Body Aches and Fever    HPI Christine Chase is a 50 y.o. female patient complaining of a constant cough for 1 week. Patient states associated with body aches. Patient denies any fever or other URI signs symptoms. Patient stated there is no nausea vomiting diarrhea. Patient states cough does not resolve over-the-counter cough medicine and anti-inflammatory medicines. Patient is a recent diagnosis hypertensive patient and started on lisinopril 3 months ago. Patient rates her pain discomfort as 8/10. No other palliative measures for this complaint.   Past Medical History  Diagnosis Date  . Hypertension     There are no active problems to display for this patient.   History reviewed. No pertinent past surgical history.  Current Outpatient Rx  Name  Route  Sig  Dispense  Refill  . lisinopril (PRINIVIL,ZESTRIL) 10 MG tablet   Oral   Take 10 mg by mouth daily.         . benzonatate (TESSALON PERLES) 100 MG capsule   Oral   Take 2 capsules (200 mg total) by mouth 3 (three) times daily as needed for cough.   30 capsule   0   . butalbital-acetaminophen-caffeine (FIORICET) 50-325-40 MG tablet   Oral   Take 1-2 tablets by mouth every 6 (six) hours as needed for headache.   20 tablet   0   . cephALEXin (KEFLEX) 500 MG capsule   Oral   Take 1 capsule (500 mg total) by mouth 2 (two) times daily.   20 capsule   0   . ondansetron (ZOFRAN ODT) 4 MG disintegrating tablet   Oral   Take 1 tablet (4 mg total) by mouth every 6 (six) hours as needed for nausea or vomiting.   20 tablet   0     Allergies Review of patient's allergies indicates no known allergies.  No family history on file.  Social History Social  History  Substance Use Topics  . Smoking status: Never Smoker   . Smokeless tobacco: None  . Alcohol Use: No    Review of Systems Constitutional: No fever/chills Eyes: No visual changes. ENT: No sore throat. Cardiovascular: Denies chest pain. Respiratory: Denies shortness of breath. Gastrointestinal: No abdominal pain.  No nausea, no vomiting.  No diarrhea.  No constipation. Genitourinary: Negative for dysuria. Musculoskeletal: Negative for back pain. Skin: Negative for rash. Neurological: Negative for headaches, focal weakness or numbness. Endocrine:Hypertension ____________________________________________   PHYSICAL EXAM:  VITAL SIGNS: ED Triage Vitals  Enc Vitals Group     BP 05/26/15 0652 143/77 mmHg     Pulse Rate 05/26/15 0652 92     Resp 05/26/15 0652 18     Temp 05/26/15 0652 97.6 F (36.4 C)     Temp Source 05/26/15 0652 Oral     SpO2 05/26/15 0652 100 %     Weight 05/26/15 0652 167 lb (75.751 kg)     Height 05/26/15 0652 5\' 5"  (1.651 m)     Head Cir --      Peak Flow --      Pain Score 05/26/15 0652 8     Pain Loc --      Pain Edu? --      Excl. in [place]? --     Constitutional: Alert and oriented. Well appearing and in  no acute distress. Eyes: Conjunctivae are normal. PERRL. EOMI. Head: Atraumatic. Nose: No congestion/rhinnorhea. Mouth/Throat: Mucous membranes are moist.  Oropharynx non-erythematous. Neck: No stridor.  No cervical spine tenderness to palpation. Hematological/Lymphatic/Immunilogical: No cervical lymphadenopathy. Cardiovascular: Normal rate, regular rhythm. Grossly normal heart sounds.  Good peripheral circulation. Respiratory: Normal respiratory effort.  No retractions. Lungs CTAB. Gastrointestinal: Soft and nontender. No distention. No abdominal bruits. No CVA tenderness. Musculoskeletal: No lower extremity tenderness nor edema.  No joint effusions. Neurologic:  Normal speech and language. No gross focal neurologic deficits are  appreciated. No gait instability. Skin:  Skin is warm, dry and intact. No rash noted. Psychiatric: Mood and affect are normal. Speech and behavior are normal.  ____________________________________________   LABS (all labs ordered are listed, but only abnormal results are displayed)  Labs Reviewed - No data to display ____________________________________________  EKG   ____________________________________________  RADIOLOGY   ____________________________________________   PROCEDURES  Procedure(s) performed: None  Critical Care performed: No  ____________________________________________   INITIAL IMPRESSION / ASSESSMENT AND PLAN / ED COURSE  Pertinent labs & imaging results that were available during my care of the patient were reviewed by me and considered in my medical decision making (see chart for details).  ACE inhibitor cough. Patient given discharge Instructions. Patient advised follow-up family doctor to discuss options for hypertension control. ____________________________________________   FINAL CLINICAL IMPRESSION(S) / ED DIAGNOSES  Final diagnoses:  Cough due to ACE inhibitor      NEW MEDICATIONS STARTED DURING THIS VISIT:  New Prescriptions   BENZONATATE (TESSALON PERLES) 100 MG CAPSULE    Take 2 capsules (200 mg total) by mouth 3 (three) times daily as needed for cough.     Note:  This document was prepared using Dragon voice recognition software and may include unintentional dictation errors.    Sable Feil, PA-C 05/26/15 [place], PA-C 05/26/15 [place], MD 05/26/15 [phone]

## 2015-05-26 NOTE — ED Notes (Signed)
States she had  a tick bite about 1 week or so ago  Just feeling bad   Body aches with dry cough.   Unsure of fever.

## 2015-05-26 NOTE — Discharge Instructions (Signed)
Advised to discuss hypertension medication side effect with family doctor. Cough, Adult A cough helps to clear your throat and lungs. A cough may last only 2-3 weeks (acute), or it may last longer than 8 weeks (chronic). Many different things can cause a cough. A cough may be a sign of an illness or another medical condition. HOME CARE  Pay attention to any changes in your cough.  Take medicines only as told by your doctor.  If you were prescribed an antibiotic medicine, take it as told by your doctor. Do not stop taking it even if you start to feel better.  Talk with your doctor before you try using a cough medicine.  Drink enough fluid to keep your pee (urine) clear or pale yellow.  If the air is dry, use a cold steam vaporizer or humidifier in your home.  Stay away from things that make you cough at work or at home.  If your cough is worse at night, try using extra pillows to raise your head up higher while you sleep.  Do not smoke, and try not to be around smoke. If you need help quitting, ask your doctor.  Do not have caffeine.  Do not drink alcohol.  Rest as needed. GET HELP IF:  You have new problems (symptoms).  You cough up yellow fluid (pus).  Your cough does not get better after 2-3 weeks, or your cough gets worse.  Medicine does not help your cough and you are not sleeping well.  You have pain that gets worse or pain that is not helped with medicine.  You have a fever.  You are losing weight and you do not know why.  You have night sweats. GET HELP RIGHT AWAY IF:  You cough up blood.  You have trouble breathing.  Your heartbeat is very fast.   This information is not intended to replace advice given to you by your health care provider. Make sure you discuss any questions you have with your health care provider.   Document Released: 09/08/2010 Document Revised: 09/16/2014 Document Reviewed: 03/04/2014 Elsevier Interactive Patient Education [employer]

## 2015-05-26 NOTE — ED Notes (Addendum)
Patient ambulatory to triage with steady gait, without difficulty or distress noted; pt reports body aches & fever since last night; reports nonprod cough since x week

## 2016-04-24 ENCOUNTER — Encounter: Payer: Self-pay | Admitting: Emergency Medicine

## 2016-04-24 ENCOUNTER — Emergency Department: Payer: BLUE CROSS/BLUE SHIELD

## 2016-04-24 ENCOUNTER — Emergency Department
Admission: EM | Admit: 2016-04-24 | Discharge: 2016-04-24 | Disposition: A | Discharge status: Home / Self Care | Payer: BLUE CROSS/BLUE SHIELD | Attending: Emergency Medicine | Admitting: Emergency Medicine

## 2016-04-24 DIAGNOSIS — Z79899 Other long term (current) drug therapy: Secondary | ICD-10-CM | POA: Diagnosis not present

## 2016-04-24 DIAGNOSIS — M5412 Radiculopathy, cervical region: Secondary | ICD-10-CM | POA: Diagnosis not present

## 2016-04-24 DIAGNOSIS — I10 Essential (primary) hypertension: Secondary | ICD-10-CM | POA: Diagnosis not present

## 2016-04-24 DIAGNOSIS — M79601 Pain in right arm: Secondary | ICD-10-CM | POA: Diagnosis present

## 2016-04-24 DIAGNOSIS — R52 Pain, unspecified: Secondary | ICD-10-CM

## 2016-04-24 LAB — CBC
HCT: 35.6 % (ref 35.0–47.0)
Hemoglobin: 11.4 g/dL — ABNORMAL LOW (ref 12.0–16.0)
MCH: 22.7 pg — AB (ref 26.0–34.0)
MCHC: 31.9 g/dL — ABNORMAL LOW (ref 32.0–36.0)
MCV: 71.1 fL — ABNORMAL LOW (ref 80.0–100.0)
PLATELETS: 533 10*3/uL — AB (ref 150–440)
RBC: 5 MIL/uL (ref 3.80–5.20)
RDW: 16.6 % — ABNORMAL HIGH (ref 11.5–14.5)
WBC: 7.7 10*3/uL (ref 3.6–11.0)

## 2016-04-24 LAB — BASIC METABOLIC PANEL
Anion gap: 7 (ref 5–15)
BUN: 14 mg/dL (ref 6–20)
CALCIUM: 8.9 mg/dL (ref 8.9–10.3)
CO2: 23 mmol/L (ref 22–32)
CREATININE: 0.63 mg/dL (ref 0.44–1.00)
Chloride: 106 mmol/L (ref 101–111)
GFR calc non Af Amer: >60 mL/min (ref >60)
Glucose, Bld: 120 mg/dL — ABNORMAL HIGH (ref 65–99)
Potassium: 3.6 mmol/L (ref 3.5–5.1)
SODIUM: 136 mmol/L (ref 135–145)

## 2016-04-24 LAB — TROPONIN I

## 2016-04-24 MED ORDER — KETOROLAC TROMETHAMINE 30 MG/ML IJ SOLN
30.0000 mg | Freq: Once | INTRAMUSCULAR | Status: AC
  Administered 2016-04-24: 30 mg via INTRAMUSCULAR

## 2016-04-24 MED ORDER — PREDNISONE 20 MG PO TABS
60.0000 mg | ORAL_TABLET | Freq: Once | ORAL | Status: AC
  Administered 2016-04-24: 60 mg via ORAL
  Filled 2016-04-24: qty 3

## 2016-04-24 MED ORDER — OXYCODONE-ACETAMINOPHEN 5-325 MG PO TABS
1.0000 | ORAL_TABLET | Freq: Four times a day (QID) | ORAL | 0 refills | Status: DC | PRN
Start: 2016-04-24 — End: 2016-05-31

## 2016-04-24 MED ORDER — PREDNISONE 50 MG PO TABS: 50.0000 mg | ORAL_TABLET | Freq: Every day | ORAL | 0 refills | Status: DC

## 2016-04-24 MED ORDER — KETOROLAC TROMETHAMINE 30 MG/ML IJ SOLN
30.0000 mg | Freq: Once | INTRAMUSCULAR | Status: DC
Start: 2016-04-24 — End: 2016-04-24
  Filled 2016-04-24: qty 1

## 2016-04-24 MED ORDER — NAPROXEN 500 MG PO TABS: 500.0000 mg | ORAL_TABLET | Freq: Two times a day (BID) | ORAL | 2 refills | Status: DC

## 2016-04-24 NOTE — ED Notes (Signed)
ED Provider at bedside. 

## 2016-04-24 NOTE — ED Provider Notes (Signed)
[facility] Emergency Department Provider Note   ____________________________________________    I have reviewed the triage vital signs and the nursing notes.   HISTORY  Chief Complaint Arm Pain     HPI Christine Chase is a 51 y.o. female who presents with complaints of right arm pain. Patient notes pain developed 2 days ago on Friday, initially it was pain in her right upper arm but then encompassed her entire arm. Patient reports intermittent episodes of severe sharp pain traveling down her entire arm. She denies neck pain. No muscle weakness. No tingling. No chest pain. She reports when she raises her arm the pain becomes significantly worse. The pain improves when she relaxes her arm at her side. No swelling reported. No coldness in the extremity. She has never had this before.   Past Medical History:  Diagnosis Date  . Hypertension     There are no active problems to display for this patient.   History reviewed. No pertinent surgical history.  Prior to Admission medications   Medication Sig Start Date End Date Taking? Authorizing Provider  verapamil (CALAN) 120 MG tablet Take 120 mg by mouth daily. 02/14/16  Yes Historical Provider, MD  naproxen (NAPROSYN) 500 MG tablet Take 1 tablet (500 mg total) by mouth 2 (two) times daily with a meal. 04/24/16   Lavonia Drafts, MD  ondansetron (ZOFRAN ODT) 4 MG disintegrating tablet Take 1 tablet (4 mg total) by mouth every 6 (six) hours as needed for nausea or vomiting. Patient not taking: Reported on 04/24/2016 08/25/14   Delman Kitten, MD  oxyCODONE-acetaminophen (ROXICET) 5-325 MG tablet Take 1 tablet by mouth every 6 (six) hours as needed for severe pain. 04/24/16 04/24/17  Lavonia Drafts, MD  predniSONE (DELTASONE) 50 MG tablet Take 1 tablet (50 mg total) by mouth daily with breakfast. 04/24/16   Lavonia Drafts, MD     Allergies Patient has no known allergies.  No family history on file.  Social  History Social History  Substance Use Topics  . Smoking status: Never Smoker  . Smokeless tobacco: Never Used  . Alcohol use No    Review of Systems  Constitutional: No fever/chills Eyes: No visual changes.  ENT: NoNeck pain Cardiovascular: Denies chest pain. Respiratory: Denies shortness of breath. Gastrointestinal: No abdominal pain.   Genitourinary: Negative for dysuria. Musculoskeletal: As above Skin: Negative for rash. Neurological: Negative for headaches or weakness  10-point ROS otherwise negative.  ____________________________________________   PHYSICAL EXAM:  VITAL SIGNS: ED Triage Vitals  Enc Vitals Group     BP 04/24/16 0544 (!) 151/78     Pulse Rate 04/24/16 0544 91     Resp 04/24/16 0544 18     Temp 04/24/16 0544 97.8 F (36.6 C)     Temp Source 04/24/16 0544 Oral     SpO2 04/24/16 0544 99 %     Weight 04/24/16 0545 161 lb (73 kg)     Height 04/24/16 0545 5\' 5"  (1.651 m)     Head Circumference --      Peak Flow --      Pain Score 04/24/16 0543 10     Pain Loc --      Pain Edu? --      Excl. in [place]? --     Constitutional: Alert and oriented. No acute distress. Pleasant and interactive Eyes: Conjunctivae are normal.   Nose: No congestion/rhinnorhea. Mouth/Throat: Mucous membranes are moist.   Neck:  Painless ROMNo vertebral tenderness to palpation.  Significant tenderness palpation along the base of the right trapezius. Cardiovascular: Normal rate, regular rhythm. Grossly normal heart sounds.  Good peripheral circulation. Respiratory: Normal respiratory effort.  No retractions. Lungs CTAB. Gastrointestinal: Soft and nontender. No distention.  No CVA tenderness.  Musculoskeletal: Upper extremities are Warm and well perfused. No erythema, no swelling. Normal strength in her hands. Normal cap refill bilaterally. Pain occurs when she raises her arm. With axial load on right arm she has severe pain. Compartments are soft Neurologic:  Normal speech and  language. No gross focal neurologic deficits are appreciated.  Skin:  Skin is warm, dry and intact. No rash noted. Psychiatric: Mood and affect are normal. Speech and behavior are normal.  ____________________________________________   LABS (all labs ordered are listed, but only abnormal results are displayed)  Labs Reviewed  BASIC METABOLIC PANEL - Abnormal; Notable for the following:       Result Value   Glucose, Bld 120 (*)    All other components within normal limits  CBC - Abnormal; Notable for the following:    Hemoglobin 11.4 (*)    MCV 71.1 (*)    MCH 22.7 (*)    MCHC 31.9 (*)    RDW 16.6 (*)    Platelets 533 (*)    All other components within normal limits  TROPONIN I   ____________________________________________  EKG  ED ECG REPORT I, Lavonia Drafts, the attending physician, personally viewed and interpreted this ECG.  Date: 04/24/2016 EKG Time: 6:15 AM Rate: 68 Rhythm: normal sinus rhythm QRS Axis: normal Intervals: normal ST/T Wave abnormalities: normal Conduction Disturbances: none Narrative Interpretation: unremarkable  ____________________________________________  RADIOLOGY  Humerus x-ray normal ____________________________________________   PROCEDURES  Procedure(s) performed: No    Critical Care performed:No ____________________________________________   INITIAL IMPRESSION / ASSESSMENT AND PLAN / ED COURSE  Pertinent labs & imaging results that were available during my care of the patient were reviewed by me and considered in my medical decision making (see chart for details).  Patient presents with right arm pain worsened with raising. Arm exam is overall reassuring, no evidence of vascular injury, no swelling or erythematous to Suggest infection. Strongly suspect cervical radiculopathy. We will treat with IM Toradol by mouth prednisone and reevaluate. EKG, labs are reassuring, no chest  pain.  ----------------------------------------- 9:43 AM on 04/24/2016 -----------------------------------------  Patient reports some improvement in her pain. We discussed obtaining MRI of the cervical spine but she is concerned that her insurance will not pay for it and would like to follow up with her PCP. Given that she has no motor weakness or sensory deficits I feel this is reasonable. I'll discharge her with NSAIDs, prednisone burst and stronger pain medication. She says she can follow-up with her PCP easily. She is to return if any worsening of her condition including weakness, sensory loss or new symptoms   ____________________________________________   FINAL CLINICAL IMPRESSION(S) / ED DIAGNOSES  Final diagnoses:  Pain  Cervical radiculopathy      NEW MEDICATIONS STARTED DURING THIS VISIT:  New Prescriptions   NAPROXEN (NAPROSYN) 500 MG TABLET    Take 1 tablet (500 mg total) by mouth 2 (two) times daily with a meal.   OXYCODONE-ACETAMINOPHEN (ROXICET) 5-325 MG TABLET    Take 1 tablet by mouth every 6 (six) hours as needed for severe pain.   PREDNISONE (DELTASONE) 50 MG TABLET    Take 1 tablet (50 mg total) by mouth daily with breakfast.     Note:  This document was  prepared using Systems analyst and may include unintentional dictation errors.    Lavonia Drafts, MD 04/24/16 [phone]

## 2016-04-24 NOTE — ED Notes (Addendum)
Patient brought back from xray. Xray tech reported to this RN that during  Xray the patient stated that she had a shooting pain to her head, became diaphoretic and felt like she was going to pass out. Spoke with Dr. Beather Arbour regarding patient new orders received for EKG, troponin, cbc and bmp.

## 2016-04-24 NOTE — ED Notes (Signed)
Pt assisted to use bathroom.

## 2016-04-24 NOTE — ED Triage Notes (Signed)
Pt states that she began experiencing right arm pain starting Friday. Pt reports that the pain is worse now and radiating to her right hand. Pt denies injury or trauma to arm. Pt is ambulatory to triage with NAD noted at this time.

## 2016-05-17 ENCOUNTER — Encounter: Payer: Self-pay | Admitting: Emergency Medicine

## 2016-05-17 ENCOUNTER — Emergency Department
Admission: EM | Admit: 2016-05-17 | Discharge: 2016-05-17 | Disposition: A | Discharge status: Home / Self Care | Payer: BLUE CROSS/BLUE SHIELD | Attending: Student in an Organized Health Care Education/Training Program | Admitting: Student in an Organized Health Care Education/Training Program

## 2016-05-17 ENCOUNTER — Emergency Department: Payer: BLUE CROSS/BLUE SHIELD

## 2016-05-17 DIAGNOSIS — I10 Essential (primary) hypertension: Secondary | ICD-10-CM | POA: Diagnosis not present

## 2016-05-17 DIAGNOSIS — M7989 Other specified soft tissue disorders: Secondary | ICD-10-CM | POA: Diagnosis present

## 2016-05-17 DIAGNOSIS — I82621 Acute embolism and thrombosis of deep veins of right upper extremity: Secondary | ICD-10-CM | POA: Diagnosis not present

## 2016-05-17 DIAGNOSIS — R609 Edema, unspecified: Secondary | ICD-10-CM

## 2016-05-17 DIAGNOSIS — Z79899 Other long term (current) drug therapy: Secondary | ICD-10-CM | POA: Diagnosis not present

## 2016-05-17 LAB — CBC
HCT: 34.9 % — ABNORMAL LOW (ref 35.0–47.0)
Hemoglobin: 11.1 g/dL — ABNORMAL LOW (ref 12.0–16.0)
MCH: 22.4 pg — ABNORMAL LOW (ref 26.0–34.0)
MCHC: 31.8 g/dL — ABNORMAL LOW (ref 32.0–36.0)
MCV: 70.3 fL — ABNORMAL LOW (ref 80.0–100.0)
PLATELETS: 615 10*3/uL — AB (ref 150–440)
RBC: 4.96 MIL/uL (ref 3.80–5.20)
RDW: 17.6 % — ABNORMAL HIGH (ref 11.5–14.5)
WBC: 9.5 10*3/uL (ref 3.6–11.0)

## 2016-05-17 LAB — BASIC METABOLIC PANEL
Anion gap: 8 (ref 5–15)
BUN: 20 mg/dL (ref 6–20)
CHLORIDE: 106 mmol/L (ref 101–111)
CO2: 24 mmol/L (ref 22–32)
CREATININE: 0.75 mg/dL (ref 0.44–1.00)
Calcium: 8.9 mg/dL (ref 8.9–10.3)
Glucose, Bld: 89 mg/dL (ref 65–99)
Potassium: 3.9 mmol/L (ref 3.5–5.1)
SODIUM: 138 mmol/L (ref 135–145)

## 2016-05-17 MED ORDER — APIXABAN 5 MG PO TABS: 5.0000 mg | ORAL_TABLET | Freq: Two times a day (BID) | ORAL | 1 refills | Status: DC

## 2016-05-17 MED ORDER — SODIUM CHLORIDE 0.9 % IV BOLUS (SEPSIS)
1000.0000 mL | Freq: Once | INTRAVENOUS | Status: AC
  Administered 2016-05-17: 1000 mL via INTRAVENOUS

## 2016-05-17 NOTE — ED Triage Notes (Signed)
C/O right shoulder pain that radiates down toward right hand.  Patient also now having right hand swelling.  Has been treated with prednisone and naprosyn.  Seen by PCP today and patient sent to ED for evaluation.

## 2016-05-17 NOTE — ED Notes (Signed)
Patient states her doctor sent her to the ED because he was concerned patient may have a blood clot in her hand.  Patient is complaining of numbness to her right hand and fingers are slightly cool to the touch.  Patient is in no obvious distress at this time.  Patient states the pain has been going on x 3 weeks but the numbness and swelling to her hand is new since yesterday.

## 2016-05-17 NOTE — ED Provider Notes (Signed)
[facility] Emergency Department Provider Note    First MD Initiated Contact with Patient 05/17/16 [phone]     (approximate)  I have reviewed the triage vital signs and the nursing notes.   HISTORY  Chief Complaint hand swelling    HPI Christine Chase is a 51 y.o. female who presents with 3 weeks of presently worsening right upper extremity pain with sudden onset swelling that occurred today. Patient was seen for similar pain most with radiculopathy. Started on prednisone and anti-inflammatories without any improvement. She denies any neck pain. States that as the pain is in her right posterior part of her shoulder radiating down the outside of her arm to her fingers. States that her hand is now numb. States that today she had an episode of significant swelling to the arm that she did not even notice. Patient does have a picture of this swelling. She went to her room air care physician who told her to go immediately to the ER. Upon arrival to the ER for swelling has significantly improved. Pain is unchanged. Denies any chest pain or shortness of breath. No neck pain. Does not smoke. No recent trauma.   Past Medical History:  Diagnosis Date  . Hypertension    No family history on file. History reviewed. No pertinent surgical history. There are no active problems to display for this patient.     Prior to Admission medications   Medication Sig Start Date End Date Taking? Authorizing Provider  naproxen (NAPROSYN) 500 MG tablet Take 1 tablet (500 mg total) by mouth 2 (two) times daily with a meal. 04/24/16  Yes Lavonia Drafts, MD  oxyCODONE-acetaminophen (ROXICET) 5-325 MG tablet Take 1 tablet by mouth every 6 (six) hours as needed for severe pain. 04/24/16 04/24/17 Yes Lavonia Drafts, MD  verapamil (CALAN) 120 MG tablet Take 120 mg by mouth daily. 02/14/16  Yes [provider]  apixaban (ELIQUIS STARTER PACK) 5 MG TABS tablet Take 1 tablet (5 mg total) by  mouth 2 (two) times daily. Take 10mg  (two pills) twice daily for seven days, then start taking just one pill (5mg ) twice daily. 05/17/16   Merlyn Lot, MD  ondansetron (ZOFRAN ODT) 4 MG disintegrating tablet Take 1 tablet (4 mg total) by mouth every 6 (six) hours as needed for nausea or vomiting. Patient not taking: Reported on 04/24/2016 08/25/14   Delman Kitten, MD  predniSONE (DELTASONE) 50 MG tablet Take 1 tablet (50 mg total) by mouth daily with breakfast. Patient not taking: Reported on 05/17/2016 04/24/16   Lavonia Drafts, MD    Allergies Patient has no known allergies.    Social History Social History  Substance Use Topics  . Smoking status: Never Smoker  . Smokeless tobacco: Never Used  . Alcohol use No    Review of Systems Patient denies headaches, rhinorrhea, blurry vision, numbness, shortness of breath, chest pain, edema, cough, abdominal pain, nausea, vomiting, diarrhea, dysuria, fevers, rashes or hallucinations unless otherwise stated above in HPI. ____________________________________________   PHYSICAL EXAM:  VITAL SIGNS: Vitals:   05/17/16 1454 05/17/16 1916  BP: (!) 153/78 (!) 144/88  Pulse: 84 78  Resp: 16 17  Temp: 98.2 F (36.8 C) 98.1 F (36.7 C)    Constitutional: Alert and oriented. Well appearing and in no acute distress. Eyes: Conjunctivae are normal. PERRL. EOMI. Head: Atraumatic. Nose: No congestion/rhinnorhea. Mouth/Throat: Mucous membranes are moist.  Oropharynx non-erythematous. Neck: No stridor. Painless ROM. No cervical spine tenderness to palpation Hematological/Lymphatic/Immunilogical: No cervical lymphadenopathy.  Cardiovascular: Normal rate, regular rhythm. Grossly normal heart sounds.  Good peripheral circulation. Respiratory: Normal respiratory effort.  No retractions. Lungs CTAB. Gastrointestinal: Soft and nontender. No distention. No abdominal bruits. No CVA tenderness. Musculoskeletal: No lower extremity tenderness nor edema.  No  joint effusions.  RUE with decreased sensation to light touch along hand to mid forearm.  2+ radial and ulnar pulses.  5/5 strenght thoughout Neurologic:  Normal speech and language. No gross focal neurologic deficits are appreciated. No gait instability. Skin:  Skin is warm, dry and intact. No rash noted. Psychiatric: Mood and affect are normal. Speech and behavior are normal.  ____________________________________________   LABS (all labs ordered are listed, but only abnormal results are displayed)  Results for orders placed or performed during the hospital encounter of 05/17/16 (from the past 24 hour(s))  Basic metabolic panel     Status: None   Collection Time: 05/17/16  4:35 PM  Result Value Ref Range   Sodium 138 135 - 145 mmol/L   Potassium 3.9 3.5 - 5.1 mmol/L   Chloride 106 101 - 111 mmol/L   CO2 24 22 - 32 mmol/L   Glucose, Bld 89 65 - 99 mg/dL   BUN 20 6 - 20 mg/dL   Creatinine, Ser 0.75 0.44 - 1.00 mg/dL   Calcium 8.9 8.9 - 10.3 mg/dL   GFR calc non Af Amer >60 >60 mL/min   GFR calc Af Amer >60 >60 mL/min   Anion gap 8 5 - 15  CBC     Status: Abnormal   Collection Time: 05/17/16  4:35 PM  Result Value Ref Range   WBC 9.5 3.6 - 11.0 K/uL   RBC 4.96 3.80 - 5.20 MIL/uL   Hemoglobin 11.1 (L) 12.0 - 16.0 g/dL   HCT 34.9 (L) 35.0 - 47.0 %   MCV 70.3 (L) 80.0 - 100.0 fL   MCH 22.4 (L) 26.0 - 34.0 pg   MCHC 31.8 (L) 32.0 - 36.0 g/dL   RDW 17.6 (H) 11.5 - 14.5 %   Platelets 615 (H) 150 - 440 K/uL   ____________________________________________  EKG____________________________________________  RADIOLOGY  I personally reviewed all radiographic images ordered to evaluate for the above acute complaints and reviewed radiology reports and findings.  These findings were personally discussed with the patient.  Please see medical record for radiology report.  ____________________________________________   PROCEDURES  Procedure(s) performed:  Procedures    Critical Care  performed: no ____________________________________________   INITIAL IMPRESSION / ASSESSMENT AND PLAN / ED COURSE  Pertinent labs & imaging results that were available during my care of the patient were reviewed by me and considered in my medical decision making (see chart for details).  DDX: dvt, radiculopathy, dissection, mass,   Christine Chase is a 51 y.o. who presents to the ED with swelling and pain and right upper extremity as described above. She has good strong pulses bilaterally. Seems less likely to be ischemia or dissection. Based on picture of swelling,  ordered ultrasound evaluate for DVT was tissue evidence of right brachial occlusive thrombus. Do feel this would explain much of her symptoms. Chest x-ray shows no evidence of abnormal vascular tissue. Do not feel that CT imaging is clinically indicated at this time. Will start patient on [facility] [place] patient has good follow-up with PCP. Does have mild thrombocytosis which could be placing her at increased risk of clot. Otherwise, cannot identify any other aggravating factors.  Have discussed with the patient and available family all diagnostics and  treatments performed thus far and all questions were answered to the best of my ability. The patient demonstrates understanding and agreement with plan.       ____________________________________________   FINAL CLINICAL IMPRESSION(S) / ED DIAGNOSES  Final diagnoses:  Swelling  Acute deep vein thrombosis (DVT) of brachial vein of right upper extremity (HCC)      NEW MEDICATIONS STARTED DURING THIS VISIT:  Discharge Medication List as of 05/17/2016  7:00 PM    START taking these medications   Details  apixaban (ELIQUIS STARTER PACK) 5 MG TABS tablet Take 1 tablet (5 mg total) by mouth 2 (two) times daily. Take 10mg  (two pills) twice daily for seven days, then start taking just one pill (5mg ) twice daily., Starting Wed 05/17/2016, Print         Note:  This document was prepared  using Dragon voice recognition software and may include unintentional dictation errors.    Merlyn Lot, MD 05/17/16 [phone]

## 2016-05-17 NOTE — ED Notes (Signed)
Pt and family taken their belongings with them, deny missing anything when leaving the ED

## 2016-05-21 ENCOUNTER — Emergency Department
Admission: EM | Admit: 2016-05-21 | Discharge: 2016-05-21 | Disposition: A | Discharge status: Home / Self Care | Payer: BLUE CROSS/BLUE SHIELD | Attending: Emergency Medicine | Admitting: Emergency Medicine

## 2016-05-21 ENCOUNTER — Encounter: Payer: Self-pay | Admitting: Intensive Care

## 2016-05-21 DIAGNOSIS — Z79899 Other long term (current) drug therapy: Secondary | ICD-10-CM | POA: Diagnosis not present

## 2016-05-21 DIAGNOSIS — Z7901 Long term (current) use of anticoagulants: Secondary | ICD-10-CM | POA: Insufficient documentation

## 2016-05-21 DIAGNOSIS — I10 Essential (primary) hypertension: Secondary | ICD-10-CM | POA: Insufficient documentation

## 2016-05-21 DIAGNOSIS — M79601 Pain in right arm: Secondary | ICD-10-CM

## 2016-05-21 DIAGNOSIS — M7989 Other specified soft tissue disorders: Secondary | ICD-10-CM | POA: Diagnosis present

## 2016-05-21 DIAGNOSIS — I82721 Chronic embolism and thrombosis of deep veins of right upper extremity: Secondary | ICD-10-CM | POA: Insufficient documentation

## 2016-05-21 MED ORDER — HYDROCODONE-ACETAMINOPHEN 5-325 MG PO TABS
1.0000 | ORAL_TABLET | Freq: Once | ORAL | Status: AC
Start: 2016-05-21 — End: 2016-05-21
  Administered 2016-05-21: 1 via ORAL
  Filled 2016-05-21: qty 1

## 2016-05-21 MED ORDER — ONDANSETRON 4 MG PO TBDP
4.0000 mg | ORAL_TABLET | Freq: Once | ORAL | Status: AC
  Administered 2016-05-21: 4 mg via ORAL
  Filled 2016-05-21: qty 1

## 2016-05-21 MED ORDER — NAPROXEN 500 MG PO TABS
500.0000 mg | ORAL_TABLET | Freq: Two times a day (BID) | ORAL | 0 refills | Status: DC | PRN
Start: 2016-05-21 — End: 2017-01-25

## 2016-05-21 MED ORDER — NAPROXEN 500 MG PO TABS
500.0000 mg | ORAL_TABLET | Freq: Once | ORAL | Status: AC
  Administered 2016-05-21: 500 mg via ORAL
  Filled 2016-05-21: qty 1

## 2016-05-21 MED ORDER — ONDANSETRON HCL 4 MG PO TABS: 4.0000 mg | ORAL_TABLET | Freq: Three times a day (TID) | ORAL | 0 refills | Status: DC | PRN

## 2016-05-21 NOTE — Discharge Instructions (Signed)
You were evaluated for right-sided arm pain which as we discussed seems consistent with ongoing known right-sided upper extremity blood clot.  Continue your Eloquis.   For pain control, you may try naproxen as a baseline level for pain control. You may also try Zofran nausea medicine to help with tolerating the narcotic pain medication that you have at home.  As we discussed, it was recommended to have some additional blood work and/or CT scan today, but you chose to hold off for now. I think this is reasonable. Return to emergency department immediately for any worsening pain, chest pain, pain with breathing, fever, shortness of breath, trouble breathing, dizziness, passing out, or any other symptoms concerning to you.

## 2016-05-21 NOTE — ED Notes (Signed)
Pt presents with pain to right chest and under right breast. Pt has recently been diagnosed with blood clot in right arm; has seen cardiologist and is due to see vein doc this Friday. Pt concerned pain is sign that clot is "moving." NAD noted.

## 2016-05-21 NOTE — ED Provider Notes (Signed)
[facility] Emergency Department Provider Note ____________________________________________   I have reviewed the triage vital signs and the triage nursing note.  HISTORY  Chief Complaint No chief complaint on file.   Historian Patient and boyfriend   HPI Christine Chase is a 51 y.o. female with history of hypertension as well as recent diagnosis several days ago with right upper extremity, right brachiocephalic DVT. She was started on Eloquis. She followed up at the cardiologist office on this past Friday and had "25 tubes of blood drawn. "  She states that she's had persistent swelling of her right arm and pain especially at her axilla and right shoulder. When she tried to go to work she has had ongoing numbness and tingling in that arm and trouble using her arm at work.  She is concerned about the possibility of the DVT making her at risk for stroke or heart attack or a blood clot.  She states that she's been quite anxious about her health because of this new diagnosis, and she's been under incredible amount of pain. She's tried occasional Tylenol. She has not been taking naproxen due to being told to avoid it while on Eloquis. She was prescribed narcotic pain medication, but states it makes her nausea did so she is not taking that either.  She has a follow-up appointment with her cardiologist on this coming Friday.  Pain is moderate to severe.  No additional chest pain. No  Shortness of breath. No pleuritic chest pain. No dizziness. No fevers. No cough.    Past Medical History:  Diagnosis Date  . Hypertension     There are no active problems to display for this patient.   Past Surgical History:  Procedure Laterality Date  . MYOMECTOMY      Prior to Admission medications   Medication Sig Start Date End Date Taking? Authorizing Provider  apixaban (ELIQUIS STARTER PACK) 5 MG TABS tablet Take 1 tablet (5 mg total) by mouth 2 (two) times daily. Take  10mg  (two pills) twice daily for seven days, then start taking just one pill (5mg ) twice daily. 05/17/16   Merlyn Lot, MD  naproxen (NAPROSYN) 500 MG tablet Take 1 tablet (500 mg total) by mouth 2 (two) times daily as needed. 05/21/16 05/21/17  Lisa Roca, MD  ondansetron (ZOFRAN ODT) 4 MG disintegrating tablet Take 1 tablet (4 mg total) by mouth every 6 (six) hours as needed for nausea or vomiting. Patient not taking: Reported on 04/24/2016 08/25/14   Delman Kitten, MD  ondansetron (ZOFRAN) 4 MG tablet Take 1 tablet (4 mg total) by mouth every 8 (eight) hours as needed for nausea or vomiting. 05/21/16   Lisa Roca, MD  oxyCODONE-acetaminophen (ROXICET) 5-325 MG tablet Take 1 tablet by mouth every 6 (six) hours as needed for severe pain. 04/24/16 04/24/17  Lavonia Drafts, MD  predniSONE (DELTASONE) 50 MG tablet Take 1 tablet (50 mg total) by mouth daily with breakfast. Patient not taking: Reported on 05/17/2016 04/24/16   Lavonia Drafts, MD  verapamil (CALAN) 120 MG tablet Take 120 mg by mouth daily. 02/14/16   [provider]    No Known Allergies  History reviewed. No pertinent family history.  Social History Social History  Substance Use Topics  . Smoking status: Never Smoker  . Smokeless tobacco: Never Used  . Alcohol use No    Review of Systems  Constitutional: Negative for fever. Eyes: Negative for visual changes. ENT: Negative for sore throat. Cardiovascular: right axilla and very a  right chest wall swelling and pain. Respiratory: Negative for shortness of breath. Gastrointestinal: Negative for abdominal pain, vomiting and diarrhea. Genitourinary: Negative for dysuria. Musculoskeletal: Negative for back pain. Skin: Negative for rash. Neurological: Negative for headache. 10 point Review of Systems otherwise negative ____________________________________________   PHYSICAL EXAM:  VITAL SIGNS: ED Triage Vitals  Enc Vitals Group     BP 05/21/16 1054 128/71      Pulse Rate 05/21/16 1054 85     Resp 05/21/16 1054 16     Temp 05/21/16 1054 99 F (37.2 C)     Temp Source 05/21/16 1054 Oral     SpO2 05/21/16 1054 97 %     Weight 05/21/16 1053 161 lb (73 kg)     Height 05/21/16 1053 5\' 5"  (1.651 m)     Head Circumference --      Peak Flow --      Pain Score 05/21/16 1055 7     Pain Loc --      Pain Edu? --      Excl. in [place]? --      Constitutional: Alert and oriented. Well appearing and in no distress.  She is quite anxious. HEENT   Head: Normocephalic and atraumatic.      Eyes: Conjunctivae are normal. PERRL. Normal extraocular movements.      Ears:         Nose: No congestion/rhinnorhea.   Mouth/Throat: Mucous membranes are moist.   Neck: No stridor. Cardiovascular/Chest: Normal rate, regular rhythm.  No murmurs, rubs, or gallops.  . Peripheral pulses right upper extremity. Respiratory: Normal respiratory effort without tachypnea nor retractions. Breath sounds are clear and equal bilaterally. No wheezes/rales/rhonchi. Gastrointestinal: Soft. No distention, no guarding, no rebound. Nontender.    Genitourinary/rectal:Deferred Musculoskeletal: moderate swelling of her right upper extremity up into her right axilla with tenderness to palpation around the axilla and across just the very lateral portion of the upper right chest wall. Neurologic:  Normal speech and language. No gross or focal neurologic deficits are appreciated. Skin:  Skin is warm, dry and intact. No rash noted. Psychiatric:fairly anxious, but overall Mood and affect are normal. Speech and behavior are normal. Patient exhibits appropriate insight and judgment.   ____________________________________________  LABS (pertinent positives/negatives)  Labs Reviewed - No data to display  ____________________________________________    EKG I, Lisa Roca, MD, the attending physician have personally viewed and interpreted all ECGs.  75 bpm. normal sinus rhythm. Narrow  QRS normal axis. Nonspecific ST and T-wave ____________________________________________  RADIOLOGY All Xrays were viewed by me. Imaging interpreted by Radiologist.  none __________________________________________  PROCEDURES  Procedure(s) performed: None  Critical Care performed: None  ____________________________________________   ED COURSE / ASSESSMENT AND PLAN  Pertinent labs & imaging results that were available during my care of the patient were reviewed by me and considered in my medical decision making (see chart for details).   Overall Christine Chase is most concerned about ongoing persistent moderate to severe pain in her right upper extremity. She states that she has not been able take Goody's powder or naproxen since the diagnosis since she was placed on Eloquis. She was under the impression that start in the Eloquis would relieve the pain. Next line next We discussed at length that the symptomatic portion may last for many weeks and she is going to need adequate pain control. We discussed risks and benefits and chose to proceed with naproxen. 4 dealing with the nausea side effect of the narcotic medication,  I am going to give her both a dose of Zofran and Norco here as well as prescribed her Zofran for home she does have a narcotic prescription at home that she has not been taking.  She also reported that she was given the option of being out of work with a cardiologist and she wanted to try going to work but this was very problematic. I will go ahead and write her out of work. In terms of the report of chest discomfort, it's really the right axilla at the location of the known right upper extremity DVT. We discussed at length that the location would seems consistent with the ongoing known DVT. I discussed obtaining blood work to evaluate for cardiac although I think this would be extremely unlikely, and/or chest CT for further evaluation. Patient did not want to have blood  drawn states that she had too many blood draws this past week anyway.  I think it's actually reasonable given that she is not having symptoms making me concerned for PE and she certainly is early on Eloquis I think be unlikely that she was having symptoms of PE.  She and her significant other were most relieved to have a long discussion about what to expect. She does have an appointment with a vascular surgeon it sounds like coming up in addition to the follow-up with a cardiologist as well.  In any case, she is going to discharge from a ER without any additional cardiac pulmonary workup, but she understands return immediately for any new or worsening symptoms.    CONSULTATIONS:   None   Patient / Family / Caregiver informed of clinical course, medical decision-making process, and agree with plan.   I discussed return precautions, follow-up instructions, and discharge instructions with patient and/or family.   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 Any transcriptional errors that result from this process are unintentional    Lisa Roca, MD 05/21/16 1415

## 2016-05-21 NOTE — ED Triage Notes (Signed)
Patient reports being diagnosed with R upper arm blood clot last week and started experiencing dull pain under her R breast since 0300. Appointment scheduled Friday to see a vein specialist. Patient reports she was told by cardiologist on Friday 05/19/16 to come back to ER if any changes occurred. A&O X4 in triage. C/o constant R arm pain. Patient is currently taking eliquis

## 2016-05-21 NOTE — ED Notes (Signed)
Par MD Quentin Cornwall, due not complete protocols. Let patient be seen in room to determine further care

## 2016-05-23 DIAGNOSIS — Z86718 Personal history of other venous thrombosis and embolism: Secondary | ICD-10-CM | POA: Insufficient documentation

## 2016-05-25 ENCOUNTER — Ambulatory Visit (INDEPENDENT_AMBULATORY_CARE_PROVIDER_SITE_OTHER): Payer: BLUE CROSS/BLUE SHIELD | Admitting: Vascular Surgery

## 2016-05-25 ENCOUNTER — Encounter (INDEPENDENT_AMBULATORY_CARE_PROVIDER_SITE_OTHER): Payer: Self-pay | Admitting: Vascular Surgery

## 2016-05-25 VITALS — BP 135/85 | HR 80 | Resp 17 | Ht 65.0 in | Wt 176.8 lb

## 2016-05-25 DIAGNOSIS — I82621 Acute embolism and thrombosis of deep veins of right upper extremity: Secondary | ICD-10-CM

## 2016-05-25 NOTE — Progress Notes (Signed)
Subjective:    Patient ID: Christine Chase, female    DOB: [birth year], 51 y.o.   MRN: [id number] Chief Complaint  Patient presents with  . New Patient (Initial Visit)   Presents as a new patient seen in [facility] ED with right upper extremity brachial DVT. She was diagnosed and placed on Eliquis. She does not have any SOB or chest pain. She diagnosed on 05/17/16. She is concerned about continued intermitted right hand swelling. Swelling is usually at night and associated with pain. Denies any fever, nausea or vomiting. Denies any ulceration to her lower extremity.    Review of Systems  Constitutional: Negative.   HENT: Negative.   Eyes: Negative.   Respiratory: Negative.   Cardiovascular: Negative.   Gastrointestinal: Negative.   Endocrine: Negative.   Genitourinary: Negative.   Musculoskeletal: Negative.   Skin: Negative.   Allergic/Immunologic: Negative.   Neurological: Negative.   Hematological: Negative.   Psychiatric/Behavioral: Negative.       Objective:   Physical Exam  Constitutional: She is oriented to person, place, and time. She appears well-developed and well-nourished. No distress.  HENT:  Head: Normocephalic and atraumatic.  Eyes: Conjunctivae are normal. Pupils are equal, round, and reactive to light.  Neck: Normal range of motion.  Cardiovascular: Normal rate, regular rhythm, normal heart sounds and intact distal pulses.   Pulses:      Radial pulses are 2+ on the right side, and 2+ on the left side.  Pulmonary/Chest: Effort normal.  Musculoskeletal: Normal range of motion. She exhibits no edema.  Neurological: She is alert and oriented to person, place, and time.  Skin: Skin is warm and dry. She is not diaphoretic.  Psychiatric: She has a normal mood and affect. Her behavior is normal. Judgment and thought content normal.  Vitals reviewed.  BP 135/85   Pulse 80   Resp 17   Ht 5\' 5"  (1.651 m)   Wt 176 lb 12.8 oz (80.2 kg)   BMI 29.42 kg/m   Past Medical  History:  Diagnosis Date  . Hypertension    Social History   Social History  . Marital status: Widowed    Spouse name: N/A  . Number of children: N/A  . Years of education: N/A   Occupational History  . Not on file.   Social History Main Topics  . Smoking status: Never Smoker  . Smokeless tobacco: Never Used  . Alcohol use No  . Drug use: No  . Sexual activity: Not on file   Other Topics Concern  . Not on file   Social History Narrative  . No narrative on file   Past Surgical History:  Procedure Laterality Date  . MYOMECTOMY     Family History  Problem Relation Age of Onset  . Hypertension Mother   . COPD Father   . Heart disease Father    No Known Allergies     Assessment & Plan:  Presents as a new patient seen in [facility] ED with right upper extremity brachial DVT. She was diagnosed and placed on Eliquis. She does not have any SOB or chest pain. She diagnosed on 05/17/16. She is concerned about continued intermitted right hand swelling. Swelling is usually at night and associated with pain. Denies any fever, nausea or vomiting. Denies any ulceration to her lower extremity.   1) Right Upper Extremity DVT - New Right Brachial DVT On Eliquis Intermittent RLE hand swelling - recommend compression sleeve for management of symptoms and elevation. Tramadol for pain.  Follow up in three months for repeat RUE DVT  Current Outpatient Prescriptions on File Prior to Visit  Medication Sig Dispense Refill  . apixaban (ELIQUIS STARTER PACK) 5 MG TABS tablet Take 1 tablet (5 mg total) by mouth 2 (two) times daily. Take 10mg  (two pills) twice daily for seven days, then start taking just one pill (5mg ) twice daily. 60 tablet 1  . naproxen (NAPROSYN) 500 MG tablet Take 1 tablet (500 mg total) by mouth 2 (two) times daily as needed. 60 tablet 0  . ondansetron (ZOFRAN ODT) 4 MG disintegrating tablet Take 1 tablet (4 mg total) by mouth every 6 (six) hours as needed for nausea or vomiting.  20 tablet 0  . ondansetron (ZOFRAN) 4 MG tablet Take 1 tablet (4 mg total) by mouth every 8 (eight) hours as needed for nausea or vomiting. 10 tablet 0  . oxyCODONE-acetaminophen (ROXICET) 5-325 MG tablet Take 1 tablet by mouth every 6 (six) hours as needed for severe pain. 20 tablet 0  . predniSONE (DELTASONE) 50 MG tablet Take 1 tablet (50 mg total) by mouth daily with breakfast. 5 tablet 0  . verapamil (CALAN) 120 MG tablet Take 120 mg by mouth daily.  1   No current facility-administered medications on file prior to visit.    There are no Patient Instructions on file for this visit. No Follow-up on file.  Kemal Amores A Anaelle Dunton, PA-C

## 2016-05-31 ENCOUNTER — Telehealth (INDEPENDENT_AMBULATORY_CARE_PROVIDER_SITE_OTHER): Payer: Self-pay

## 2016-05-31 ENCOUNTER — Encounter: Payer: Self-pay | Admitting: Emergency Medicine

## 2016-05-31 ENCOUNTER — Emergency Department: Payer: BLUE CROSS/BLUE SHIELD

## 2016-05-31 ENCOUNTER — Emergency Department
Admission: EM | Admit: 2016-05-31 | Discharge: 2016-05-31 | Disposition: A | Discharge status: Home / Self Care | Payer: BLUE CROSS/BLUE SHIELD | Attending: Emergency Medicine | Admitting: Emergency Medicine

## 2016-05-31 DIAGNOSIS — M79601 Pain in right arm: Secondary | ICD-10-CM

## 2016-05-31 DIAGNOSIS — R0789 Other chest pain: Secondary | ICD-10-CM | POA: Diagnosis not present

## 2016-05-31 DIAGNOSIS — I10 Essential (primary) hypertension: Secondary | ICD-10-CM | POA: Insufficient documentation

## 2016-05-31 DIAGNOSIS — Z79899 Other long term (current) drug therapy: Secondary | ICD-10-CM | POA: Insufficient documentation

## 2016-05-31 DIAGNOSIS — I82621 Acute embolism and thrombosis of deep veins of right upper extremity: Secondary | ICD-10-CM | POA: Insufficient documentation

## 2016-05-31 DIAGNOSIS — I82409 Acute embolism and thrombosis of unspecified deep veins of unspecified lower extremity: Secondary | ICD-10-CM

## 2016-05-31 LAB — BASIC METABOLIC PANEL
Anion gap: 6 (ref 5–15)
BUN: 13 mg/dL (ref 6–20)
CHLORIDE: 105 mmol/L (ref 101–111)
CO2: 26 mmol/L (ref 22–32)
CREATININE: 0.71 mg/dL (ref 0.44–1.00)
Calcium: 9.1 mg/dL (ref 8.9–10.3)
GFR calc non Af Amer: >60 mL/min (ref >60)
Glucose, Bld: 97 mg/dL (ref 65–99)
POTASSIUM: 4 mmol/L (ref 3.5–5.1)
Sodium: 137 mmol/L (ref 135–145)

## 2016-05-31 LAB — CBC
HEMATOCRIT: 35.7 % (ref 35.0–47.0)
HEMOGLOBIN: 11.4 g/dL — AB (ref 12.0–16.0)
MCH: 22.2 pg — AB (ref 26.0–34.0)
MCHC: 32.1 g/dL (ref 32.0–36.0)
MCV: 69.2 fL — ABNORMAL LOW (ref 80.0–100.0)
Platelets: 510 10*3/uL — ABNORMAL HIGH (ref 150–440)
RBC: 5.15 MIL/uL (ref 3.80–5.20)
RDW: 17.5 % — ABNORMAL HIGH (ref 11.5–14.5)
WBC: 11 10*3/uL (ref 3.6–11.0)

## 2016-05-31 LAB — PROTIME-INR
INR: 1
Prothrombin Time: 13.2 seconds (ref 11.4–15.2)

## 2016-05-31 LAB — APTT: APTT: 37 s — AB (ref 24–36)

## 2016-05-31 MED ORDER — IOPAMIDOL (ISOVUE-300) INJECTION 61%
75.0000 mL | Freq: Once | INTRAVENOUS | Status: AC | PRN
  Administered 2016-05-31: 75 mL via INTRAVENOUS

## 2016-05-31 NOTE — ED Triage Notes (Signed)
Patient presents to ED via POV from home due coagulation studies being abnormal per patients pcp. Patient was dx with a blood clot in her right arm 1 month ago. Patient on eliquis. PCP told her "It must not be strong enough and you may need to be given something through your IV", per patient.

## 2016-05-31 NOTE — ED Provider Notes (Signed)
[facility] Emergency Department Provider Note  ____________________________________________   First MD Initiated Contact with Patient 05/31/16 1739     (approximate)  I have reviewed the triage vital signs and the nursing notes.   HISTORY  Chief Complaint Abnormal Lab    HPI in Christine Chase is a 51 y.o. female who was diagnosed with a right brachial spontaneous DVT approximately 10 days ago based on ultrasound report from an ED visit to this department.  She was started on Eliquis and has followed up with the vascular clinic, as well as with cardiology and her PCP.  She presents today after being told by the nurse practitioner at the vascular center that she needed to be admitted for IV anticoagulation because her symptoms have not improved.  She reports that she continues to have swelling from her shoulder down to her hand that comes and goes and is worse at night than in the morning.  the swelling is gone right now but is typically worse after she has been at work and at night.  The aching pain also comes and goes and ranges from mild to severe and currently it is mild.  Using her arm seems to make it worse and rest makes it a little bit better.  She describes persistent numbness in her first second and third fingers of her right hand that is not getting any better.  Overall she states that her symptoms are no better and no worse.  Today She followed up with her cardiologist, Dr. Ubaldo Glassing, as well as Joelene Millin at [facility] vascular clinic.  Joelene Millin reportedly told her that if she is still having symptoms then she should go to the emergency department and be admitted for IV anticoagulation, but as of the time of my evaluation there is no supporting clinic note.  She denies fever/chills, chest pain, shortness of breath, nausea, vomiting, abdominal pain.  She does have some intermittent pain and swelling in her right shoulder and the upper right anterior portion of her  chest wall that she associates with the blood clot in her right arm.  She reportedly had her hypercoagulability studies performed and they were unremarkable.  She showed me a photo of the swelling in her hand from 10 days ago, and it was very obvious, although it is not present currently.   Past Medical History:  Diagnosis Date  . Hypertension     There are no active problems to display for this patient.   Past Surgical History:  Procedure Laterality Date  . MYOMECTOMY      Prior to Admission medications   Medication Sig Start Date End Date Taking? Authorizing Provider  apixaban (ELIQUIS STARTER PACK) 5 MG TABS tablet Take 1 tablet (5 mg total) by mouth 2 (two) times daily. Take 10mg  (two pills) twice daily for seven days, then start taking just one pill (5mg ) twice daily. 05/17/16  Yes Merlyn Lot, MD  HYDROcodone-acetaminophen (NORCO/VICODIN) 5-325 MG tablet Take 1-2 tablets by mouth every 6 (six) hours as needed for moderate pain.   Yes [provider]  losartan (COZAAR) 50 MG tablet Take 50 mg by mouth daily.   Yes [provider]  verapamil (CALAN) 120 MG tablet Take 120 mg by mouth daily. 02/14/16  Yes [provider]  naproxen (NAPROSYN) 500 MG tablet Take 1 tablet (500 mg total) by mouth 2 (two) times daily as needed. 05/21/16 05/21/17  Lisa Roca, MD  ondansetron (ZOFRAN ODT) 4 MG disintegrating tablet Take 1 tablet (  4 mg total) by mouth every 6 (six) hours as needed for nausea or vomiting. Patient not taking: Reported on 05/31/2016 08/25/14   Delman Kitten, MD  ondansetron (ZOFRAN) 4 MG tablet Take 1 tablet (4 mg total) by mouth every 8 (eight) hours as needed for nausea or vomiting. Patient not taking: Reported on 05/31/2016 05/21/16   Lisa Roca, MD  predniSONE (DELTASONE) 50 MG tablet Take 1 tablet (50 mg total) by mouth daily with breakfast. Patient not taking: Reported on 05/31/2016 04/24/16   Lavonia Drafts, MD    Allergies Patient has no known  allergies.  Family History  Problem Relation Age of Onset  . Hypertension Mother   . COPD Father   . Heart disease Father     Social History Social History  Substance Use Topics  . Smoking status: Never Smoker  . Smokeless tobacco: Never Used  . Alcohol use No    Review of Systems Constitutional: No fever/chills Eyes: No visual changes. ENT: No sore throat. Cardiovascular: Occasional superior right anterior chest wall pain and swelling associated with swelling in her right shoulder and right arm Respiratory: Denies shortness of breath. Gastrointestinal: No abdominal pain.  No nausea, no vomiting.  No diarrhea.  No constipation. Genitourinary: Negative for dysuria. Musculoskeletal: Intermittent pain and swelling in the right upper extremity associated with a recently diagnosed brachial DVT. Denies neck and back pain. Integumentary: Negative for rash. Neurological: Persistent numbness in the thumb, index, and middle finger of her right hand.  No weakness.  ____________________________________________   PHYSICAL EXAM:  VITAL SIGNS: ED Triage Vitals  Enc Vitals Group     BP 05/31/16 1703 (!) 149/79     Pulse Rate 05/31/16 1703 92     Resp 05/31/16 1703 17     Temp 05/31/16 1703 98 F (36.7 C)     Temp Source 05/31/16 1703 Oral     SpO2 05/31/16 1703 98 %     Weight 05/31/16 1704 77.6 kg (171 lb)     Height 05/31/16 1704 1.651 m (5\' 5" )     Head Circumference --      Peak Flow --      Pain Score 05/31/16 1703 7     Pain Loc --      Pain Edu? --      Excl. in [place]? --     Constitutional: Alert and oriented. Well appearing and in no acute distress. Eyes: Conjunctivae are normal.  Head: Atraumatic. Nose: No congestion/rhinnorhea. Mouth/Throat: Mucous membranes are moist. Neck: No stridor.  No meningeal signs.   Cardiovascular: Normal rate, regular rhythm. Good peripheral circulation. Grossly normal heart sounds. Respiratory: Normal respiratory effort.  No  retractions. Lungs CTAB. Gastrointestinal: Soft and nontender. No distention.  Musculoskeletal: No lower extremity tenderness nor edema. No gross deformities of extremities. Neurologic:  Normal speech and language. No gross focal neurologic deficits are appreciated although the patient does report some numbness in the right hand and arm (thumbs, index, and middle fingers primarily).  Normal range of motion of the right upper extremity although it reproduces some pain and tenderness Skin:  Skin is warm, dry and intact. No rash noted. Psychiatric: Mood and affect are normal. Speech and behavior are normal.  ____________________________________________   LABS (all labs ordered are listed, but only abnormal results are displayed)  Labs Reviewed  CBC - Abnormal; Notable for the following:       Result Value   Hemoglobin 11.4 (*)    MCV 69.2 (*)  MCH 22.2 (*)    RDW 17.5 (*)    Platelets 510 (*)    All other components within normal limits  APTT - Abnormal; Notable for the following:    aPTT 37 (*)    All other components within normal limits  PROTIME-INR  BASIC METABOLIC PANEL   ____________________________________________  EKG  None - EKG not ordered by ED physician ____________________________________________  RADIOLOGY   Ct Chest W Contrast  Result Date: 05/31/2016 CLINICAL DATA:  Patient presents to ED via POV from home due coagulation studies being abnormal per patients pcp. Patient was dx with a blood clot in her right arm 1 month ago. Patient on eliquis. EXAM: CT CHEST WITH CONTRAST TECHNIQUE: Multidetector CT imaging of the chest was performed during intravenous contrast administration. CONTRAST:  60mL ISOVUE-300 IOPAMIDOL (ISOVUE-300) INJECTION 61% COMPARISON:  Radiograph 598 FINDINGS: Cardiovascular: No significant vascular findings. Normal heart size. Great vessels normal. No pericardial effusion. Subclavian vessels appear patent. Mediastinum/Nodes: No axillary  supraclavicular adenopathy. No mediastinal hilar adenopathy. No pericardial fluid. Esophagus normal. Lungs/Pleura: No pulmonary infection pleural fluid. No edema or pneumothorax. Airways are normal. Upper Abdomen: Limited view of the liver, kidneys, pancreas are unremarkable. Normal adrenal glands. Musculoskeletal: No aggressive osseous lesion. IMPRESSION: No acute findings in the thorax. Electronically Signed   By: Suzy Bouchard M.D.   On: 05/31/2016 19:15   US Venous Img Upper Uni Right  Result Date: 05/31/2016 CLINICAL DATA:  History of right upper extremity DVT. Worsening swelling. EXAM: RIGHT UPPER EXTREMITY VENOUS DOPPLER ULTRASOUND TECHNIQUE: Gray-scale sonography with graded compression, as well as color Doppler and duplex ultrasound were performed to evaluate the upper extremity deep venous system from the level of the subclavian vein and including the jugular, axillary, basilic, radial, ulnar and upper cephalic vein. Spectral Doppler was utilized to evaluate flow at rest and with distal augmentation maneuvers. COMPARISON:  05/17/2016 FINDINGS: Contralateral Subclavian Vein: Color Doppler flow in the left subclavian vein. Internal Jugular Vein: No thrombus. Normal compressibility, color Doppler flow and phasicity. Subclavian Vein: No thrombus.  Normal color Doppler flow. Axillary Vein: No evidence of thrombus. Normal compressibility, respiratory phasicity and response to augmentation. Cephalic Vein: No evidence of thrombus. Normal compressibility, respiratory phasicity and response to augmentation. Basilic Vein: No evidence of thrombus. Normal compressibility, respiratory phasicity and response to augmentation. Brachial Veins: There is compressibility and color Doppler flow in two right brachial veins. There is a small heterogeneous structure adjacent to the brachial artery which probably represents adjacent nerve. This is not consistent with an acute thrombosed vein. However, if this were a  thrombosed vein, it would be chronic rather than acute. Radial and ulnar veins are not well visualized on this examination. IMPRESSION: No evidence for an acute DVT in the right upper extremity. Small heterogeneous structure adjacent to the right brachial artery is compatible with nerve rather than a thrombosed vein. Electronically Signed   By: Markus Daft M.D.   On: 05/31/2016 21:01    ____________________________________________   PROCEDURES  Critical Care performed: No   Procedure(s) performed:   Procedures   ____________________________________________   INITIAL IMPRESSION / ASSESSMENT AND PLAN / ED COURSE  Pertinent labs & imaging results that were available during my care of the patient were reviewed by me and considered in my medical decision making (see chart for details).   Clinical Course as of Jun 01 2339  Wed May 31, 2016  1827 I spoke by phone with Dr. Delana Meyer to discuss the case.  I reviewed  with him the information that I have available to me from the patient as well as from the EMR.  He recommended that we repeat the ultrasound to see if the DVT has propagated.  He also recommended a CT scan of the chest with IV contrast to look for Pancoast tumor or other causes that could potentially lead to thoracic outlet syndrome even though the chest x-ray was normal upon her original visit.  If these studies are normal then she should continue on the Eliquis and follow-up as an outpatient.  Otherwise we will disposition appropriately.  I updated the patient and we will proceed as described.  [CF]  1922 No acute thoracic abnormalities CT Chest W Contrast [CF]  2114 I spoke by phone with Dr. Anselm Pancoast (radiology) who called me to discuss the ultrasound results.  He believes, based on the images tonight, that not only does she not have a DVT, but he thinks that the original ultrasound images read as a DVT was actually the median nerve.  The official report has come back and also reports no  DVT and probable median nerve rather than a thrombus.  I am currently calling to verify with the radiologist, but then I will discuss these results with the patient.  [CF]  2147 I have dated the patient about the possible misdiagnosis on the original ultrasound.  I have asked patient relations to talk to her about her options for discussing the incident with the hospital given all the subsequent care and treatment that was provided after the possible misdiagnosis.  I am paging Dr. Delana Meyer to discuss the case again and let him know about the update.  As of right now the plan is for the patient to follow up with orthopedics and use a removable Velcro wrist splint for symptoms most consistent with carpal tunnel syndrome.  Neck pain and that the radiation of the pain seems to start in the shoulder so cervical radiculopathy seems less likely.  She has had no traumatic injuries and currently she continues to have no physical findings consistent with either DVT or any neurological issue.  [CF]  2209 I updated Dr. Delana Meyer.  He pointed out that the patient has had extensive vascular workup and feels based on information I provided that there is no indication for the patient to continue going to the vascular surgery clinic.  He and I agreed that there is also likely no indication for the patient to continue on Eliquis given the certainty of the ultrasound interpretations by Dr. Louanna Raw.  She agrees with the plan to follow up with orthopedics and she has plenty of pain medication at home.  I gave her my usual and customary return precautions.  Additionally, Dr. Ubaldo Glassing told her that she might benefit from following up with neurology or neurosurgery and told her that he had the ability to get her in for an appointment by Friday; I encouraged her to contact him about this because I do not have the ability to get her into see a neuro specialist that quickly.  She and her husband are understanding about the situation and agree with  the plan even though she understandably remains frustrated.  [CF]    Clinical Course User Index [CF] Hinda Kehr, MD    ____________________________________________  FINAL CLINICAL IMPRESSION(S) / ED DIAGNOSES  Final diagnoses:    Right arm pain     MEDICATIONS GIVEN DURING THIS VISIT:  Medications  iopamidol (ISOVUE-300) 61 % injection 75 mL (75 mLs Intravenous Contrast  Given 05/31/16 1849)      Note:  This document was prepared using Dragon voice recognition software and may include unintentional dictation errors.    Hinda Kehr, MD 05/31/16 2342

## 2016-05-31 NOTE — Telephone Encounter (Signed)
Patient called stating that she has increased swelling and numbness in her hand. Christine Chase stated verbally that she needs to go to the ED/ER to be further evaluated. She says that she will go and be examined.

## 2016-05-31 NOTE — ED Notes (Signed)
Pt requesting to take her home hydrocodone for pain.  EDP okay with this.  Pt provided with ginger ale as well at this time.

## 2016-05-31 NOTE — Telephone Encounter (Signed)
Patient called back wanting to know if there is something else that can be done instead of going to the ED because she has a $500 co-pay for the ED, she wants to know if she could be admitted to the hospital since she was seen here. I attempted to explain to the patient that what she has been told to do regarding the DVT in her arm is what can be done from our office but that going to the ED to be evaluated she could possibly be admitted for IV blood thinner but she would have to be evaluated there and with a vascular consult as well.

## 2016-05-31 NOTE — ED Notes (Signed)
Pt A/OX4, resp even and unlabored.  Pt sts that she was sent by PCP, who informed pt that she would need to be admitted for IV anticoagulants. Pt has known R arm blood clot, was diagnosed 1 month PTA.  Pt sts that she is still having numbness, tingling and swelling to R arm. Pt sts denies other medical issues at this time and that PCP visit was follow up. Pt frustrated that she was not able to be directly admitted to hospital.

## 2016-05-31 NOTE — Discharge Instructions (Signed)
As we discussed, in spite of the prior diagnosis of deep vein thrombosis (DVT) in the right brachial vein of the right arm, the radiologist reading your ultrasound today believes that this diagnosis was incorrect and that you did not have a blood clot.  As a result, we recommend that you stop taking the blood thinner (Eliquis) since it can have side effects and consequences and there is no indication for you to take it if you do not have a DVT.  Please note that although "DVT" is listed on your discharge diagnoses, it is because that diagnosis was associated with your ultrasound today and cannot be removed; as of information currently available to [place], we do NOT believe you have a DVT.  We provided you with a CD of your chest CT and both ultrasounds.  We encourage you to take this with you if you follow up in a different clinic or different hospital system.  In the meantime, we encourage you to use your Velcro wrist splint and schedule an appointment with orthopedics for further evalution.  Additionally, since Dr. Ubaldo Glassing felt you might benefit from seeing a neurologist or neurosurgeon, we encourage you to contact him about a referral to the specialist he considered.  Return to the emergency department if you develop new or worsening symptoms that concern you.

## 2016-06-01 ENCOUNTER — Encounter: Payer: Self-pay | Admitting: Emergency Medicine

## 2016-06-01 NOTE — Telephone Encounter (Signed)
Patient went to the ED to be evaluated and was told that she did not have a DVT, other tests were done as well. Per Hezzie Bump P.A. The patient does not need to return for a DVT follow up ultrasound due to the patient not having a DVT.

## 2016-06-02 ENCOUNTER — Telehealth (INDEPENDENT_AMBULATORY_CARE_PROVIDER_SITE_OTHER): Payer: Self-pay

## 2016-06-02 NOTE — Telephone Encounter (Signed)
[name] from the ED called stating the patient was called to discuss her ED visit on 05/31/16 and she stated she wasn't told to discontinue the Eliquis ( she was put on the Eliquis in the ED )she was taking and no one would tell her she could stop taking it. As we were talking I asked her to look at the ED report from 05/31/16 and she read where the patient was told to stop the Eliquis and how she was seen by Dr. Ubaldo Glassing. Per [name] a message came up as she was reading stating Dr. Ubaldo Glassing or his office called the patient to let her know she should stop the Eliquis and they were getting her scheduled to see a neurologist.

## 2016-08-28 ENCOUNTER — Encounter (INDEPENDENT_AMBULATORY_CARE_PROVIDER_SITE_OTHER): Payer: BLUE CROSS/BLUE SHIELD

## 2016-08-28 ENCOUNTER — Ambulatory Visit (INDEPENDENT_AMBULATORY_CARE_PROVIDER_SITE_OTHER): Payer: BLUE CROSS/BLUE SHIELD | Admitting: Vascular Surgery

## 2016-10-02 ENCOUNTER — Emergency Department: Payer: BLUE CROSS/BLUE SHIELD

## 2016-10-02 ENCOUNTER — Emergency Department
Admission: EM | Admit: 2016-10-02 | Discharge: 2016-10-02 | Disposition: A | Discharge status: Home / Self Care | Payer: BLUE CROSS/BLUE SHIELD | Attending: Emergency Medicine | Admitting: Emergency Medicine

## 2016-10-02 ENCOUNTER — Encounter: Payer: Self-pay | Admitting: Emergency Medicine

## 2016-10-02 DIAGNOSIS — I82629 Acute embolism and thrombosis of deep veins of unspecified upper extremity: Secondary | ICD-10-CM | POA: Diagnosis not present

## 2016-10-02 DIAGNOSIS — Z7901 Long term (current) use of anticoagulants: Secondary | ICD-10-CM | POA: Insufficient documentation

## 2016-10-02 DIAGNOSIS — Z79899 Other long term (current) drug therapy: Secondary | ICD-10-CM | POA: Insufficient documentation

## 2016-10-02 DIAGNOSIS — M5412 Radiculopathy, cervical region: Secondary | ICD-10-CM | POA: Insufficient documentation

## 2016-10-02 DIAGNOSIS — M79603 Pain in arm, unspecified: Secondary | ICD-10-CM | POA: Diagnosis present

## 2016-10-02 HISTORY — DX: Acute embolism and thrombosis of deep veins of unspecified upper extremity: I82.629

## 2016-10-02 LAB — CBC WITH DIFFERENTIAL/PLATELET
BASOS ABS: 0.1 10*3/uL (ref 0–0.1)
Basophils Relative: 1 %
EOS PCT: 2 %
Eosinophils Absolute: 0.2 10*3/uL (ref 0–0.7)
HEMATOCRIT: 34.2 % — AB (ref 35.0–47.0)
Hemoglobin: 10.9 g/dL — ABNORMAL LOW (ref 12.0–16.0)
Lymphocytes Relative: 29 %
Lymphs Abs: 3 10*3/uL (ref 1.0–3.6)
MCH: 22.5 pg — AB (ref 26.0–34.0)
MCHC: 31.9 g/dL — AB (ref 32.0–36.0)
MCV: 70.5 fL — AB (ref 80.0–100.0)
MONO ABS: 1 10*3/uL — AB (ref 0.2–0.9)
MONOS PCT: 10 %
Neutro Abs: 5.8 10*3/uL (ref 1.4–6.5)
Neutrophils Relative %: 58 %
PLATELETS: 541 10*3/uL — AB (ref 150–440)
RBC: 4.86 MIL/uL (ref 3.80–5.20)
RDW: 17.6 % — AB (ref 11.5–14.5)
WBC: 10.1 10*3/uL (ref 3.6–11.0)

## 2016-10-02 LAB — BASIC METABOLIC PANEL
Anion gap: 9 (ref 5–15)
BUN: 15 mg/dL (ref 6–20)
CALCIUM: 9.2 mg/dL (ref 8.9–10.3)
CO2: 23 mmol/L (ref 22–32)
CREATININE: 0.72 mg/dL (ref 0.44–1.00)
Chloride: 106 mmol/L (ref 101–111)
GFR calc Af Amer: >60 mL/min (ref >60)
GLUCOSE: 114 mg/dL — AB (ref 65–99)
Potassium: 3.3 mmol/L — ABNORMAL LOW (ref 3.5–5.1)
Sodium: 138 mmol/L (ref 135–145)

## 2016-10-02 MED ORDER — PREDNISONE 20 MG PO TABS
60.0000 mg | ORAL_TABLET | Freq: Once | ORAL | Status: AC
  Administered 2016-10-02: 60 mg via ORAL
  Filled 2016-10-02: qty 3

## 2016-10-02 MED ORDER — OXYCODONE-ACETAMINOPHEN 5-325 MG PO TABS: 1.0000 | ORAL_TABLET | Freq: Four times a day (QID) | ORAL | 0 refills | Status: DC | PRN

## 2016-10-02 MED ORDER — OXYCODONE-ACETAMINOPHEN 5-325 MG PO TABS
1.0000 | ORAL_TABLET | Freq: Once | ORAL | Status: AC
  Administered 2016-10-02: 1 via ORAL
  Filled 2016-10-02: qty 1

## 2016-10-02 MED ORDER — PREDNISONE 20 MG PO TABS: 60.0000 mg | ORAL_TABLET | Freq: Every day | ORAL | 0 refills | Status: DC

## 2016-10-02 NOTE — Discharge Instructions (Signed)
you have declined MRI here which is you  choice but does limit my ability to diagnose you. if you have numbness, weakness, incontinence of bowel or bladder, worsening pain or other symptoms please return to the emergency room. Follow closely with primary care and neurosurgery as well as neurology. do not take any anticoagulation unless strongly advised by a physician at this time there is no evidence of blood clot. your platelet count is routinely high, this is not new today but we ask you to follow closely with her doctor for that. You  must not drive or operate heavy machinery or drink after taking Percocet. I would follow up first with neurosurgery and then with neurology.

## 2016-10-02 NOTE — ED Notes (Signed)
Spoke with Dr. Quentin Cornwall in regards to patient presentation. See orders.

## 2016-10-02 NOTE — ED Triage Notes (Signed)
Patient presents to ED via POV from home with c/o right arm pain. Hx of DVT in right arm. Patient no on blood thinners.

## 2016-10-02 NOTE — ED Provider Notes (Signed)
[facility] Emergency Department Provider Note  ____________________________________________   I have reviewed the triage vital signs and the nursing notes.   HISTORY  Chief Complaint Arm Pain    HPI Christine Chase is a 51 y.o. female  Christine Chase today complaining of pain in her arm. This has been going on since at least April of this year. Patient does have a long history of back issues and arthritis in her back. She was seen in April and diagnosed with radiculopathy and given steroids but she did not take the steroids because she thought the steroids would affect her mood. Therefore, she does not know the steroids work. She has had a sharp pain that goes down her entire arm and ends up in the second and third digit of the right hand. She has tingling in those fingers. She was seen here and she stated that at one point she had some swelling so an ultrasound was performed. The ultrasound was read as positive for DVT and she was placed on blood thinners. However 2 weeks later she had another ultrasound which stated that there was no evidence of blood clot however there was a unusual likely nerve position next to the vein which was likely the cause of the false positive. She then went and had another ultrasound at an outside facility and they thought that it was a blood clot again so they started her back on anti-coagulation. Despite all this patient has had the same exact pain going on from May until late August. In the August the pain went away and she was doing better she was taken off L [place]. At no time during the last several months that she had any swelling and she certainly does not have any now. The patient states that several days ago, that nerve like pain came back. She feels an aching sharp pain that shoots all the way down her arm into her second and third digit. She has no weakness no incontinence of bowel or bladder, she denies any focal findings aside from this. She  states that she does not have a lot of neck pain but it is worse when she changes position and when she lies the wrong way and that it is also worse when she looks all the way to the right towards the affected area. She denies any neck trauma and she states that these are the exact same symptoms she had before. She is not very concerned about whether she has another blood clot.      Past Medical History:  Diagnosis Date  . DVT of upper extremity (deep vein thrombosis) ([place])   . Hypertension     There are no active problems to display for this patient.   Past Surgical History:  Procedure Laterality Date  . MYOMECTOMY      Prior to Admission medications   Medication Sig Start Date End Date Taking? Authorizing Provider  apixaban (ELIQUIS STARTER PACK) 5 MG TABS tablet Take 1 tablet (5 mg total) by mouth 2 (two) times daily. Take 10mg  (two pills) twice daily for seven days, then start taking just one pill (5mg ) twice daily. 05/17/16   Merlyn Lot, MD  HYDROcodone-acetaminophen (NORCO/VICODIN) 5-325 MG tablet Take 1-2 tablets by mouth every 6 (six) hours as needed for moderate pain.    [provider]  losartan (COZAAR) 50 MG tablet Take 50 mg by mouth daily.    [provider]  naproxen (NAPROSYN) 500 MG tablet Take 1 tablet (500  mg total) by mouth 2 (two) times daily as needed. 05/21/16 05/21/17  Lisa Roca, MD  ondansetron (ZOFRAN ODT) 4 MG disintegrating tablet Take 1 tablet (4 mg total) by mouth every 6 (six) hours as needed for nausea or vomiting. Patient not taking: Reported on 05/31/2016 08/25/14   Delman Kitten, MD  ondansetron (ZOFRAN) 4 MG tablet Take 1 tablet (4 mg total) by mouth every 8 (eight) hours as needed for nausea or vomiting. Patient not taking: Reported on 05/31/2016 05/21/16   Lisa Roca, MD  predniSONE (DELTASONE) 50 MG tablet Take 1 tablet (50 mg total) by mouth daily with breakfast. Patient not taking: Reported on 05/31/2016 04/24/16   Lavonia Drafts, MD  verapamil (CALAN) 120 MG tablet Take 120 mg by mouth daily. 02/14/16   [provider]    Allergies Patient has no known allergies.  Family History  Problem Relation Age of Onset  . Hypertension Mother   . COPD Father   . Heart disease Father     Social History Social History  Substance Use Topics  . Smoking status: Never Smoker  . Smokeless tobacco: Never Used  . Alcohol use No    Review of Systems Constitutional: No fever/chills Eyes: No visual changes. ENT: No sore throat. No stiff neck no neck pain Cardiovascular: Denies chest pain. Respiratory: Denies shortness of breath. Gastrointestinal:   no vomiting.  No diarrhea.  No constipation. Genitourinary: Negative for dysuria. Musculoskeletal: Negative lower extremity swelling Skin: Negative for rash. Neurological:the history of present illness   ____________________________________________   PHYSICAL EXAM:  VITAL SIGNS: ED Triage Vitals [10/02/16 1808]  Enc Vitals Group     BP (!) 155/80     Pulse Rate (!) 101     Resp 17     Temp 98.2 F (36.8 C)     Temp Source Oral     SpO2 99 %     Weight 165 lb (74.8 kg)     Height 5\' 5"  (1.651 m)     Head Circumference      Peak Flow      Pain Score 10     Pain Loc      Pain Edu?      Excl. in [place]?     Constitutional: Alert and oriented. Well appearing and in no acute distress. Eyes: Conjunctivae are normal Head: Atraumatic HEENT: No congestion/rhinnorhea. Mucous membranes are moist.  Oropharynx non-erythematous Neck:   there is minimal tenderness to palpation around C7 laterally, not midline, which reproduces some of the patient's pain.also, when I have the patient turn her neck to the right, she gets a pain shooting down her arm Cardiovascular: Normal rate, regular rhythm. Grossly normal heart sounds.  Good peripheral circulation. Respiratory: Normal respiratory effort.  No retractions. Lungs CTAB. Abdominal: Soft and nontender. No  distention. No guarding no rebound Back:  There is no focal tenderness or step off.  there is no midline tenderness there are no lesions noted. there is no CVA tenderness Musculoskeletal: No lower extremity tenderness, no upper extremity tenderness. No joint effusions, no DVT signs strong distal pulses no edema Neurologic: Cranial nerves II through XII are grossly intact 5 out of 5 strength bilateral upper and lower extremity. Finger to nose within normal limits heel to shin within normal limits, speech is normal with no word finding difficulty or dysarthria, reflexes symmetric, pupils are equally round and reactive to light, there is no pronator drift, sensation is normal, vision is intact to confrontation, gait is  dnormal, there is some subjective tingling to the third and fourth digit only with no weakness. Sensation is intact. Skin:  Skin is warm, dry and intact. No rash noted. Psychiatric: Mood and affect are normal. Speech and behavior are normal.  ____________________________________________   LABS (all labs ordered are listed, but only abnormal results are displayed)  Labs Reviewed  CBC WITH DIFFERENTIAL/PLATELET - Abnormal; Notable for the following:       Result Value   Hemoglobin 10.9 (*)    HCT 34.2 (*)    MCV 70.5 (*)    MCH 22.5 (*)    MCHC 31.9 (*)    RDW 17.6 (*)    Platelets 541 (*)    Monocytes Absolute 1.0 (*)    All other components within normal limits  BASIC METABOLIC PANEL - Abnormal; Notable for the following:    Potassium 3.3 (*)    Glucose, Bld 114 (*)    All other components within normal limits    Pertinent labs  results that were available during my care of the patient were reviewed by me and considered in my medical decision making (see chart for details). ____________________________________________  EKG  I personally interpreted any EKGs ordered by me or triage  ____________________________________________  RADIOLOGY  Pertinent labs & imaging  results that were available during my care of the patient were reviewed by me and considered in my medical decision making (see chart for details). If possible, patient and/or family made aware of any abnormal findings. ____________________________________________    PROCEDURES  Procedure(s) performed: None  Procedures  Critical Care performed: None  ____________________________________________   INITIAL IMPRESSION / ASSESSMENT AND PLAN / ED COURSE  Pertinent labs & imaging results that were available during my care of the patient were reviewed by me and considered in my medical decision making (see chart for details).  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 I've discussed with Dr. Lacinda Axon of neurosurgery who advised the steroids. Patient does consent to take them this time. I think that they will help them. I also talked to Dr. Lorraine Lax, of neurology who agrees with outpatient follow-up. I did offer the patient an emergent MRI to get to the bottom of this and she refuses. She is she states claustrophobic and would prefer to go to an outpatient facility that has an open MRI. This is certainly her choice but she understands it limits my ability to diagnose her. In addition, we do note the patient's platelets are elevated and have been for at least 2 years. I strongly advised her to follow closely with her primary care doctor for further evaluation of that. It is unclear the etiology. Some recent studies suggest that this can be concerning and I would advise that she follows up and she understands that. Patient otherwise in no acute distress, we will have her follow closely with outpatient and we will discharge her. There is no evidence of ACS PE dissection referred pain, patient did have a negative CT scan of her chest for this already which was reassuring at the time and I  don't think that she has any symptoms this time of PE either. Patient and family are very comfortable with this plan and follow-up and discharge.    ____________________________________________   FINAL CLINICAL IMPRESSION(S) / ED DIAGNOSES  Final diagnoses:  DVT of upper extremity (deep vein thrombosis) (  [place])      This chart was dictated using voice recognition software.  Despite best efforts to proofread,  errors can occur which can change meaning.      Schuyler Amor, MD 10/02/16 2119

## 2017-01-23 ENCOUNTER — Emergency Department
Admission: EM | Admit: 2017-01-23 | Discharge: 2017-01-23 | Disposition: A | Discharge status: Home / Self Care | Payer: BLUE CROSS/BLUE SHIELD | Attending: Emergency Medicine | Admitting: Emergency Medicine

## 2017-01-23 ENCOUNTER — Encounter: Payer: Self-pay | Admitting: Medical Oncology

## 2017-01-23 DIAGNOSIS — A0811 Acute gastroenteropathy due to Norwalk agent: Secondary | ICD-10-CM | POA: Insufficient documentation

## 2017-01-23 DIAGNOSIS — Z79899 Other long term (current) drug therapy: Secondary | ICD-10-CM | POA: Insufficient documentation

## 2017-01-23 DIAGNOSIS — I10 Essential (primary) hypertension: Secondary | ICD-10-CM | POA: Insufficient documentation

## 2017-01-23 MED ORDER — ACETAMINOPHEN 325 MG PO TABS
650.0000 mg | ORAL_TABLET | Freq: Once | ORAL | Status: AC
  Administered 2017-01-23: 650 mg via ORAL
  Filled 2017-01-23: qty 2

## 2017-01-23 MED ORDER — PROMETHAZINE HCL 25 MG PO TABS: 25.0000 mg | ORAL_TABLET | Freq: Three times a day (TID) | ORAL | 0 refills | Status: DC | PRN

## 2017-01-23 MED ORDER — ONDANSETRON 4 MG PO TBDP
4.0000 mg | ORAL_TABLET | Freq: Once | ORAL | Status: AC
  Administered 2017-01-23: 4 mg via ORAL
  Filled 2017-01-23: qty 1

## 2017-01-23 NOTE — ED Triage Notes (Signed)
Pt reports she began last night with fever, chills, body aches, and rt ear ache.

## 2017-01-23 NOTE — ED Notes (Signed)
See triage note  Presents with body aches which started today  Headache   Also had some vomiting this am subjective fever  But afebrile on arrival

## 2017-01-23 NOTE — Discharge Instructions (Signed)
Your exam and symptoms are consistent with a likely viral gastroenteritis. Take the anti-nausea medicine as needed. Drink plenty of fluids to prevent dehydration. Follow-up with your provider for continued symptoms.

## 2017-01-25 NOTE — ED Provider Notes (Signed)
[facility] Emergency Department Provider Note ____________________________________________  Time seen: 1800  I have reviewed the triage vital signs and the nursing notes.  HISTORY  Chief Complaint  Generalized Body Aches; Fever; and Otalgia  HPI Christine Chase is a 52 y.o. female presents to the ED for evaluation of fevers, chills, bodyaches, nausea, vomiting, and diarrhea. She denies any abdominal pain, but notes some mild headache. She works at a [employer], but denies any obvios sick contacts. She did not receive the seasonal flu vaccine. She denies any cough, congestion, or sinus symptoms. She has taken Goody's powders with limited benefit.   Past Medical History:  Diagnosis Date  . DVT of upper extremity (deep vein thrombosis) ([place])   . Hypertension     There are no active problems to display for this patient.   Past Surgical History:  Procedure Laterality Date  . MYOMECTOMY      Prior to Admission medications   Medication Sig Start Date End Date Taking? Authorizing Provider  HYDROcodone-acetaminophen (NORCO/VICODIN) 5-325 MG tablet Take 1-2 tablets by mouth every 6 (six) hours as needed for moderate pain.    [provider]  losartan (COZAAR) 50 MG tablet Take 50 mg by mouth daily.    [provider]  naproxen (NAPROSYN) 500 MG tablet Take 1 tablet (500 mg total) by mouth 2 (two) times daily as needed. 05/21/16 05/21/17  Lisa Roca, MD  ondansetron (ZOFRAN ODT) 4 MG disintegrating tablet Take 1 tablet (4 mg total) by mouth every 6 (six) hours as needed for nausea or vomiting. Patient not taking: Reported on 05/31/2016 08/25/14   Delman Kitten, MD  ondansetron (ZOFRAN) 4 MG tablet Take 1 tablet (4 mg total) by mouth every 8 (eight) hours as needed for nausea or vomiting. Patient not taking: Reported on 05/31/2016 05/21/16   Lisa Roca, MD  oxyCODONE-acetaminophen (ROXICET) 5-325 MG tablet Take 1 tablet by mouth every 6 (six)  hours as needed. 10/02/16 10/02/17  Schuyler Amor, MD  predniSONE (DELTASONE) 20 MG tablet Take 3 tablets (60 mg total) by mouth daily. 10/02/16   Schuyler Amor, MD  promethazine (PHENERGAN) 25 MG tablet Take 1 tablet (25 mg total) by mouth every 8 (eight) hours as needed for nausea or vomiting. 01/23/17   Nikolaos Maddocks, Dannielle Karvonen, PA-C  verapamil (CALAN) 120 MG tablet Take 120 mg by mouth daily. 02/14/16   [provider]    Allergies Patient has no known allergies.  Family History  Problem Relation Age of Onset  . Hypertension Mother   . COPD Father   . Heart disease Father     Social History Social History   Tobacco Use  . Smoking status: Never Smoker  . Smokeless tobacco: Never Used  Substance Use Topics  . Alcohol use: No  . Drug use: No    Review of Systems  Constitutional: Negative for fever. Eyes: Negative for visual changes. ENT: Negative for sore throat. Cardiovascular: Negative for chest pain. Respiratory: Negative for shortness of breath. Gastrointestinal: Negative for abdominal pain. Reports nausea, vomiting and diarrhea. Genitourinary: Negative for dysuria. Musculoskeletal: Negative for back pain. Skin: Negative for rash. Neurological: Negative for focal weakness or numbness. Describes mild headache ____________________________________________  PHYSICAL EXAM:  VITAL SIGNS: ED Triage Vitals [01/23/17 1711]  Enc Vitals Group     BP (!) 153/91     Pulse Rate 99     Resp 16     Temp 97.7 F (36.5 C)     Temp  Source Oral     SpO2 99 %     Weight 165 lb (74.8 kg)     Height 5\' 5"  (1.651 m)     Head Circumference      Peak Flow      Pain Score 10     Pain Loc      Pain Edu?      Excl. in [place]?     Constitutional: Alert and oriented. Well appearing and in no distress. Head: Normocephalic and atraumatic. Eyes: Conjunctivae are normal. Normal extraocular movements Ears: Canals clear. TMs intact bilaterally. Nose: No  congestion/rhinorrhea/epistaxis. Mouth/Throat: Mucous membranes are moist. Tonsils flat. No oropharyngeal lesions Neck: Supple. No thyromegaly. Hematological/Lymphatic/Immunological: No cervical lymphadenopathy. Cardiovascular: Normal rate, regular rhythm. Normal distal pulses. Respiratory: Normal respiratory effort. No wheezes/rales/rhonchi. Gastrointestinal: Soft and nontender. No distention, rebound, guarding, or organomegaly. Normal bowel sounds.  Musculoskeletal: Nontender with normal range of motion in all extremities.  Neurologic:  Normal gait without ataxia. Normal speech and language. No gross focal neurologic deficits are appreciated. Skin:  Skin is warm, dry and intact. No rash noted. ____________________________________________  PROCEDURES  Procedures Tylenol 650 mg PO Zofran 4 mg ODT ____________________________________________  INITIAL IMPRESSION / ASSESSMENT AND PLAN / ED COURSE  Patient with ED evaluation of symptoms that seem most consistent with a viral gastroenteritis. She has a benign exam and stable vital signs. She is discharged with a prescription for promethazine for nausea & vomiting. She will employ the BRAT diet and hydrate to prevent dehydration. A work note is provided for 3 days. Return precautions have been reviewed.  ____________________________________________  FINAL CLINICAL IMPRESSION(S) / ED DIAGNOSES  Final diagnoses:  Viral gastroenteritis due to [place]-like agent     Carmie End, Dannielle Karvonen, PA-C 01/25/17 1847    Nena Polio, MD 01/26/17 2148

## 2017-02-14 ENCOUNTER — Other Ambulatory Visit: Payer: Self-pay

## 2017-02-14 ENCOUNTER — Emergency Department
Admission: EM | Admit: 2017-02-14 | Discharge: 2017-02-14 | Disposition: A | Discharge status: Home / Self Care | Payer: Self-pay | Attending: Emergency Medicine | Admitting: Emergency Medicine

## 2017-02-14 ENCOUNTER — Encounter: Payer: Self-pay | Admitting: Emergency Medicine

## 2017-02-14 DIAGNOSIS — Z79899 Other long term (current) drug therapy: Secondary | ICD-10-CM | POA: Insufficient documentation

## 2017-02-14 DIAGNOSIS — R05 Cough: Secondary | ICD-10-CM | POA: Insufficient documentation

## 2017-02-14 DIAGNOSIS — M7918 Myalgia, other site: Secondary | ICD-10-CM | POA: Insufficient documentation

## 2017-02-14 DIAGNOSIS — J101 Influenza due to other identified influenza virus with other respiratory manifestations: Secondary | ICD-10-CM | POA: Insufficient documentation

## 2017-02-14 DIAGNOSIS — I10 Essential (primary) hypertension: Secondary | ICD-10-CM | POA: Insufficient documentation

## 2017-02-14 LAB — INFLUENZA PANEL BY PCR (TYPE A & B)
INFLAPCR: POSITIVE — AB
INFLBPCR: NEGATIVE

## 2017-02-14 LAB — GROUP A STREP BY PCR: Group A Strep by PCR: NOT DETECTED

## 2017-02-14 MED ORDER — PSEUDOEPH-BROMPHEN-DM 30-2-10 MG/5ML PO SYRP: 5.0000 mL | ORAL_SOLUTION | Freq: Four times a day (QID) | ORAL | 0 refills | Status: DC | PRN

## 2017-02-14 MED ORDER — HYDROCOD POLST-CPM POLST ER 10-8 MG/5ML PO SUER
5.0000 mL | Freq: Once | ORAL | Status: AC
  Administered 2017-02-14: 5 mL via ORAL
  Filled 2017-02-14: qty 5

## 2017-02-14 MED ORDER — OSELTAMIVIR PHOSPHATE 75 MG PO CAPS: 75.0000 mg | ORAL_CAPSULE | Freq: Two times a day (BID) | ORAL | 0 refills | Status: AC

## 2017-02-14 MED ORDER — KETOROLAC TROMETHAMINE 60 MG/2ML IM SOLN
60.0000 mg | Freq: Once | INTRAMUSCULAR | Status: DC
  Filled 2017-02-14: qty 2

## 2017-02-14 MED ORDER — IBUPROFEN 600 MG PO TABS: 600.0000 mg | ORAL_TABLET | Freq: Three times a day (TID) | ORAL | 0 refills | Status: DC | PRN

## 2017-02-14 NOTE — ED Provider Notes (Signed)
[facility] Emergency Department Provider Note   ____________________________________________   First MD Initiated Contact with Patient 02/14/17 1739     (approximate)  I have reviewed the triage vital signs and the nursing notes.   HISTORY  Chief Complaint flu like symptoms    HPI Christine Chase is a 52 y.o. female patient presents with generalized malaise, body aches, nonproductive cough, nasal congestion, sore throat and nausea.  Patient denies vomiting or diarrhea.  Patient has not taken a flu shot for this season.  Patient rates the pain as a 10/10.  Patient described the pain "generalized aching".   Past Medical History:  Diagnosis Date  . DVT of upper extremity (deep vein thrombosis) ([place])   . Hypertension     There are no active problems to display for this patient.   Past Surgical History:  Procedure Laterality Date  . MYOMECTOMY      Prior to Admission medications   Medication Sig Start Date End Date Taking? Authorizing Provider  brompheniramine-pseudoephedrine-DM 30-2-10 MG/5ML syrup Take 5 mLs by mouth 4 (four) times daily as needed. 02/14/17   Sable Feil, PA-C  ibuprofen (ADVIL,MOTRIN) 600 MG tablet Take 1 tablet (600 mg total) by mouth every 8 (eight) hours as needed. 02/14/17   Sable Feil, PA-C  losartan (COZAAR) 50 MG tablet Take 50 mg by mouth daily.    [provider]  oseltamivir (TAMIFLU) 75 MG capsule Take 1 capsule (75 mg total) by mouth 2 (two) times daily for 5 days. 02/14/17 02/19/17  Sable Feil, PA-C  promethazine (PHENERGAN) 25 MG tablet Take 1 tablet (25 mg total) by mouth every 8 (eight) hours as needed for nausea or vomiting. 01/23/17   Menshew, Dannielle Karvonen, PA-C  verapamil (CALAN) 120 MG tablet Take 120 mg by mouth daily. 02/14/16   [provider]    Allergies Patient has no known allergies.  Family History  Problem Relation Age of Onset  . Hypertension Mother   . COPD Father   .  Heart disease Father     Social History Social History   Tobacco Use  . Smoking status: Never Smoker  . Smokeless tobacco: Never Used  Substance Use Topics  . Alcohol use: No  . Drug use: No    Review of Systems Constitutional: No fever/chills.  He aches Eyes: No visual changes. ENT: Sore throat and nasal congestion Cardiovascular: Denies chest pain. Respiratory: Denies shortness of breath. Gastrointestinal: No abdominal pain.  Nausea without vomiting.  No diarrhea.  No constipation. Genitourinary: Negative for dysuria. Musculoskeletal: Negative for back pain. Skin: Negative for rash. Neurological: Negative for headaches, focal weakness or numbness.   ____________________________________________   PHYSICAL EXAM:  VITAL SIGNS: ED Triage Vitals  Enc Vitals Group     BP 02/14/17 1718 (!) 153/101     Pulse Rate 02/14/17 1718 87     Resp 02/14/17 1718 18     Temp 02/14/17 1718 98.2 F (36.8 C)     Temp Source 02/14/17 1718 Oral     SpO2 02/14/17 1718 95 %     Weight 02/14/17 1716 165 lb (74.8 kg)     Height 02/14/17 1716 5\' 5"  (1.651 m)     Head Circumference --      Peak Flow --      Pain Score 02/14/17 1715 10     Pain Loc --      Pain Edu? --      Excl. in [place]? --  Constitutional: Alert and oriented. Well appearing and in no acute distress. Nose: Edematous nasal turbinates clear rhinorrhea Mouth/Throat: Mucous membranes are moist.  Oropharynx non-erythematous.  Postnasal drainage Neck: No stridor.  Hematological/Lymphatic/Immunilogical: No cervical lymphadenopathy. Cardiovascular: Normal rate, regular rhythm. Grossly normal heart sounds.  Good peripheral circulation. Respiratory: Normal respiratory effort.  No retractions. Lungs CTAB. Gastrointestinal: Soft and nontender. No distention. No abdominal bruits. No CVA tenderness. Neurologic:  Normal speech and language. No gross focal neurologic deficits are appreciated. No gait instability. Skin:  Skin is warm,  dry and intact. No rash noted. Psychiatric: Mood and affect are normal. Speech and behavior are normal.  ____________________________________________   LABS (all labs ordered are listed, but only abnormal results are displayed)  Labs Reviewed  INFLUENZA PANEL BY PCR (TYPE A & B) - Abnormal; Notable for the following components:      Result Value   Influenza A By PCR POSITIVE (*)    All other components within normal limits  GROUP A STREP BY PCR   ____________________________________________  EKG   ____________________________________________  RADIOLOGY  ED MD interpretation:    Official radiology report(s): No results found.  ____________________________________________   PROCEDURES  Procedure(s) performed: None  Procedures  Critical Care performed: No  ____________________________________________   INITIAL IMPRESSION / ASSESSMENT AND PLAN / ED COURSE  As part of my medical decision making, I reviewed the following data within the electronic MEDICAL RECORD NUMBER    Viral illness secondary to influenza A.  Patient given discharge care instruction.  Patient advised take medication as directed.  Patient given a work note.        ____________________________________________   FINAL CLINICAL IMPRESSION(S) / ED DIAGNOSES  Final diagnoses:  Influenza A     ED Discharge Orders        Ordered    oseltamivir (TAMIFLU) 75 MG capsule  2 times daily     02/14/17 1845    brompheniramine-pseudoephedrine-DM 30-2-10 MG/5ML syrup  4 times daily PRN     02/14/17 1845    ibuprofen (ADVIL,MOTRIN) 600 MG tablet  Every 8 hours PRN     02/14/17 1845       Note:  This document was prepared using Dragon voice recognition software and may include unintentional dictation errors.    Sable Feil, PA-C 02/14/17 [place], [place], MD 02/15/17 1501

## 2017-02-14 NOTE — ED Triage Notes (Signed)
General malaise, body aches, cough, congestion, sore throat since Monday. Kid at her school had flu. NAd. Unlabored.

## 2017-02-18 ENCOUNTER — Emergency Department
Admission: EM | Admit: 2017-02-18 | Discharge: 2017-02-18 | Disposition: A | Discharge status: Home / Self Care | Payer: Self-pay | Attending: Emergency Medicine | Admitting: Emergency Medicine

## 2017-02-18 ENCOUNTER — Emergency Department: Payer: Self-pay

## 2017-02-18 ENCOUNTER — Encounter: Payer: Self-pay | Admitting: Emergency Medicine

## 2017-02-18 DIAGNOSIS — Z79899 Other long term (current) drug therapy: Secondary | ICD-10-CM | POA: Insufficient documentation

## 2017-02-18 DIAGNOSIS — I10 Essential (primary) hypertension: Secondary | ICD-10-CM | POA: Insufficient documentation

## 2017-02-18 DIAGNOSIS — J111 Influenza due to unidentified influenza virus with other respiratory manifestations: Secondary | ICD-10-CM

## 2017-02-18 DIAGNOSIS — J069 Acute upper respiratory infection, unspecified: Secondary | ICD-10-CM | POA: Insufficient documentation

## 2017-02-18 MED ORDER — ALBUTEROL SULFATE HFA 108 (90 BASE) MCG/ACT IN AERS: 2.0000 | INHALATION_SPRAY | Freq: Four times a day (QID) | RESPIRATORY_TRACT | 0 refills | Status: DC | PRN

## 2017-02-18 MED ORDER — AZITHROMYCIN 250 MG PO TABS: ORAL_TABLET | ORAL | 0 refills | Status: DC

## 2017-02-18 MED ORDER — PREDNISONE 10 MG PO TABS: ORAL_TABLET | ORAL | 0 refills | Status: DC

## 2017-02-18 MED ORDER — IPRATROPIUM-ALBUTEROL 0.5-2.5 (3) MG/3ML IN SOLN
3.0000 mL | Freq: Once | RESPIRATORY_TRACT | Status: AC
  Administered 2017-02-18: 3 mL via RESPIRATORY_TRACT
  Filled 2017-02-18: qty 3

## 2017-02-18 NOTE — ED Provider Notes (Signed)
[facility] Emergency Department Provider Note  ____________________________________________  Time seen: Approximately 2:01 PM  I have reviewed the triage vital signs and the nursing notes.   HISTORY  Chief Complaint Cough    HPI Christine Chase is a 52 y.o. female that presents emergency department for evaluation of nasal congestion, cough for 1 week.  Patient has a dry cough that is keeping her up at night.  Patient was seen in the emergency department last week and tested positive for  influenza.  She took the Tamiflu but has not had any improvement in symptoms. Patient does not smoke. No fever, chills, nausea, vomiting, abdominal pain.  Past Medical History:  Diagnosis Date  . DVT of upper extremity (deep vein thrombosis) ([place])   . Hypertension     There are no active problems to display for this patient.   Past Surgical History:  Procedure Laterality Date  . MYOMECTOMY      Prior to Admission medications   Medication Sig Start Date End Date Taking? Authorizing Provider  albuterol (PROVENTIL HFA;VENTOLIN HFA) 108 (90 Base) MCG/ACT inhaler Inhale 2 puffs into the lungs every 6 (six) hours as needed for wheezing or shortness of breath. 02/18/17   Laban Emperor, PA-C  azithromycin (ZITHROMAX Z-PAK) 250 MG tablet Take 2 tablets (500 mg) on  Day 1,  followed by 1 tablet (250 mg) once daily on Days 2 through 5. 02/18/17   Laban Emperor, PA-C  brompheniramine-pseudoephedrine-DM 30-2-10 MG/5ML syrup Take 5 mLs by mouth 4 (four) times daily as needed. 02/14/17   Sable Feil, PA-C  ibuprofen (ADVIL,MOTRIN) 600 MG tablet Take 1 tablet (600 mg total) by mouth every 8 (eight) hours as needed. 02/14/17   Sable Feil, PA-C  losartan (COZAAR) 50 MG tablet Take 50 mg by mouth daily.    [provider]  oseltamivir (TAMIFLU) 75 MG capsule Take 1 capsule (75 mg total) by mouth 2 (two) times daily for 5 days. 02/14/17 02/19/17  Sable Feil, PA-C   predniSONE (DELTASONE) 10 MG tablet Take 6 tablets on day 1, take 5 tablets on day 2, take 4 tablets on day 3, take 3 tablets on day 4, take 2 tablets on day 5, take 1 tablet on day 6 02/18/17   Laban Emperor, PA-C  promethazine (PHENERGAN) 25 MG tablet Take 1 tablet (25 mg total) by mouth every 8 (eight) hours as needed for nausea or vomiting. 01/23/17   Menshew, Dannielle Karvonen, PA-C  verapamil (CALAN) 120 MG tablet Take 120 mg by mouth daily. 02/14/16   [provider]    Allergies Patient has no known allergies.  Family History  Problem Relation Age of Onset  . Hypertension Mother   . COPD Father   . Heart disease Father     Social History Social History   Tobacco Use  . Smoking status: Never Smoker  . Smokeless tobacco: Never Used  Substance Use Topics  . Alcohol use: No  . Drug use: No     Review of Systems  Constitutional: No fever/chills Eyes: No visual changes. No discharge. ENT: Positive for congestion and rhinorrhea. Cardiovascular: No chest pain. Respiratory: Positive for cough. No SOB. Gastrointestinal: No abdominal pain.  No nausea, no vomiting.  No diarrhea.  No constipation. Musculoskeletal: Negative for musculoskeletal pain. Skin: Negative for rash, abrasions, lacerations, ecchymosis. Neurological: Negative for headaches.   ____________________________________________   PHYSICAL EXAM:  VITAL SIGNS: ED Triage Vitals [02/18/17 1149]  Enc Vitals Group  BP (!) 134/99     Pulse Rate 94     Resp 20     Temp 98.5 F (36.9 C)     Temp Source Oral     SpO2 98 %     Weight 165 lb (74.8 kg)     Height 5\' 5"  (1.651 m)     Head Circumference      Peak Flow      Pain Score 0     Pain Loc      Pain Edu?      Excl. in [place]?      Constitutional: Alert and oriented. Well appearing and in no acute distress. Eyes: Conjunctivae are normal. PERRL. EOMI. No discharge. Head: Atraumatic. ENT: No frontal and maxillary sinus tenderness.      Ears:  Tympanic membranes pearly gray with good landmarks. No discharge.      Nose: Mild congestion/rhinnorhea.      Mouth/Throat: Mucous membranes are moist. Oropharynx non-erythematous. Tonsils not enlarged. No exudates. Uvula midline. Neck: No stridor.   Hematological/Lymphatic/Immunilogical: No cervical lymphadenopathy. Cardiovascular: Normal rate, regular rhythm.  Good peripheral circulation. Respiratory: Normal respiratory effort without tachypnea or retractions. Lungs CTAB. Good air entry to the bases with no decreased or absent breath sounds. Gastrointestinal: Bowel sounds 4 quadrants. Soft and nontender to palpation. No guarding or rigidity. No palpable masses. No distention. Musculoskeletal: Full range of motion to all extremities. No gross deformities appreciated. Neurologic:  Normal speech and language. No gross focal neurologic deficits are appreciated.  Skin:  Skin is warm, dry and intact. No rash noted.   ____________________________________________   LABS (all labs ordered are listed, but only abnormal results are displayed)  Labs Reviewed - No data to display ____________________________________________  EKG   ____________________________________________  RADIOLOGY Robinette Haines, personally viewed and evaluated these images (plain radiographs) as part of my medical decision making, as well as reviewing the written report by the radiologist.  Dg Chest 2 View  Result Date: 02/18/2017 CLINICAL DATA:  Productive cough EXAM: CHEST  2 VIEW COMPARISON:  05/17/2016 FINDINGS: Heart and mediastinal contours are within normal limits. No focal opacities or effusions. No acute bony abnormality. IMPRESSION: No active cardiopulmonary disease. Electronically Signed   By: Rolm Baptise M.D.   On: 02/18/2017 12:18    ____________________________________________    PROCEDURES  Procedure(s) performed:    Procedures    Medications  ipratropium-albuterol (DUONEB) 0.5-2.5 (3)  MG/3ML nebulizer solution 3 mL (3 mLs Nebulization Given 02/18/17 1343)     ____________________________________________   INITIAL IMPRESSION / ASSESSMENT AND PLAN / ED COURSE  Pertinent labs & imaging results that were available during my care of the patient were reviewed by me and considered in my medical decision making (see chart for details).  Review of the [place] CSRS was performed in accordance of the [place] prior to dispensing any controlled drugs.  P patient presented to the emergency department for evaluation of worsening nasal congestion and cough after being diagnosed with influenza v early last week.  VItal signs and exam are reassuring.  Chest x-ray negative for acute cardiopulmonary processes.  Patient will be covered for bacterial causes.  Patient appears well and is staying well hydrated. Patient feels comfortable going home. Patient will be discharged home with prescriptions for prednisone, azithromycin, albuterol inhaler. Patient is to follow up with PCP as needed or otherwise directed. Patient is given ED precautions to return to the ED for any worsening or new symptoms.     ____________________________________________  FINAL CLINICAL IMPRESSION(S) / ED DIAGNOSES  Final diagnoses:  Upper respiratory tract infection, unspecified type  Influenza      NEW MEDICATIONS STARTED DURING THIS VISIT:  ED Discharge Orders        Ordered    albuterol (PROVENTIL HFA;VENTOLIN HFA) 108 (90 Base) MCG/ACT inhaler  Every 6 hours PRN     02/18/17 1400    predniSONE (DELTASONE) 10 MG tablet     02/18/17 1400    azithromycin (ZITHROMAX Z-PAK) 250 MG tablet     02/18/17 1400          This chart was dictated using voice recognition software/Dragon. Despite best efforts to proofread, errors can occur which can change the meaning. Any change was purely unintentional.    Laban Emperor, PA-C 02/18/17 1534    Schuyler Amor, MD 02/21/17 [id number]

## 2017-02-18 NOTE — ED Triage Notes (Signed)
Pt reports dx here last week with the flu, reports sx not any better. Reports heavy mucous in chest, reports unable to cough anything up.

## 2017-08-27 ENCOUNTER — Emergency Department: Payer: Self-pay

## 2017-08-27 ENCOUNTER — Other Ambulatory Visit: Payer: Self-pay

## 2017-08-27 ENCOUNTER — Emergency Department
Admission: EM | Admit: 2017-08-27 | Discharge: 2017-08-27 | Disposition: A | Discharge status: Home / Self Care | Payer: Self-pay | Attending: Emergency Medicine | Admitting: Emergency Medicine

## 2017-08-27 ENCOUNTER — Encounter: Payer: Self-pay | Admitting: Emergency Medicine

## 2017-08-27 DIAGNOSIS — Z79899 Other long term (current) drug therapy: Secondary | ICD-10-CM | POA: Insufficient documentation

## 2017-08-27 DIAGNOSIS — Y939 Activity, unspecified: Secondary | ICD-10-CM | POA: Insufficient documentation

## 2017-08-27 DIAGNOSIS — X58XXXA Exposure to other specified factors, initial encounter: Secondary | ICD-10-CM | POA: Insufficient documentation

## 2017-08-27 DIAGNOSIS — Y999 Unspecified external cause status: Secondary | ICD-10-CM | POA: Insufficient documentation

## 2017-08-27 DIAGNOSIS — Y929 Unspecified place or not applicable: Secondary | ICD-10-CM | POA: Insufficient documentation

## 2017-08-27 DIAGNOSIS — I10 Essential (primary) hypertension: Secondary | ICD-10-CM | POA: Insufficient documentation

## 2017-08-27 DIAGNOSIS — S60222A Contusion of left hand, initial encounter: Secondary | ICD-10-CM | POA: Insufficient documentation

## 2017-08-27 NOTE — ED Provider Notes (Signed)
[facility] Emergency Department Provider Note ____________________________________________  Time seen: 1946  I have reviewed the triage vital signs and the nursing notes.  HISTORY  Chief Complaint  Joint Swelling  HPI Christine Chase is a 52 y.o. female presents to the ED accompanied by her husband, for evaluation of swelling to the left hand.  Patient denies any known injury, accident, or trauma.  She was sitting on the floor eating dinner, before the onset.  She does report pushing herself off the floor with her hands, before she noticed that there was some subtle swelling to the dorsal aspect of the left hand.  She noted the area looks somewhat bluish in color and was tender to touch.  She denies any difficulty with grip strength or range of motion of the hand or wrist.  She denies any distal paresthesias, skin temp or color changes, or weakness.  Past Medical History:  Diagnosis Date  . DVT of upper extremity (deep vein thrombosis) ([place])   . Hypertension     There are no active problems to display for this patient.   Past Surgical History:  Procedure Laterality Date  . MYOMECTOMY      Prior to Admission medications   Medication Sig Start Date End Date Taking? Authorizing Provider  albuterol (PROVENTIL HFA;VENTOLIN HFA) 108 (90 Base) MCG/ACT inhaler Inhale 2 puffs into the lungs every 6 (six) hours as needed for wheezing or shortness of breath. 02/18/17   Laban Emperor, PA-C  azithromycin (ZITHROMAX Z-PAK) 250 MG tablet Take 2 tablets (500 mg) on  Day 1,  followed by 1 tablet (250 mg) once daily on Days 2 through 5. 02/18/17   Laban Emperor, PA-C  brompheniramine-pseudoephedrine-DM 30-2-10 MG/5ML syrup Take 5 mLs by mouth 4 (four) times daily as needed. 02/14/17   Sable Feil, PA-C  ibuprofen (ADVIL,MOTRIN) 600 MG tablet Take 1 tablet (600 mg total) by mouth every 8 (eight) hours as needed. 02/14/17   Sable Feil, PA-C  losartan (COZAAR) 50 MG tablet  Take 50 mg by mouth daily.    [provider]  predniSONE (DELTASONE) 10 MG tablet Take 6 tablets on day 1, take 5 tablets on day 2, take 4 tablets on day 3, take 3 tablets on day 4, take 2 tablets on day 5, take 1 tablet on day 6 02/18/17   Laban Emperor, PA-C  promethazine (PHENERGAN) 25 MG tablet Take 1 tablet (25 mg total) by mouth every 8 (eight) hours as needed for nausea or vomiting. 01/23/17   Kyli Sorter, Dannielle Karvonen, PA-C  verapamil (CALAN) 120 MG tablet Take 120 mg by mouth daily. 02/14/16   [provider]    Allergies Patient has no known allergies.  Family History  Problem Relation Age of Onset  . Hypertension Mother   . COPD Father   . Heart disease Father     Social History Social History   Tobacco Use  . Smoking status: Never Smoker  . Smokeless tobacco: Never Used  Substance Use Topics  . Alcohol use: No  . Drug use: No    Review of Systems  Constitutional: Negative for fever. Cardiovascular: Negative for chest pain. Respiratory: Negative for shortness of breath. Musculoskeletal: Negative for back pain.  Left hand swelling as above. Skin: Negative for rash. Neurological: Negative for headaches, focal weakness or numbness. ____________________________________________  PHYSICAL EXAM:  VITAL SIGNS: ED Triage Vitals  Enc Vitals Group     BP 08/27/17 1818 (!) 158/82  Pulse Rate 08/27/17 1818 95     Resp 08/27/17 1818 20     Temp 08/27/17 1818 98.3 F (36.8 C)     Temp Source 08/27/17 1818 Oral     SpO2 08/27/17 1818 100 %     Weight 08/27/17 1809 164 lb 14.5 oz (74.8 kg)     Height 08/27/17 1809 5\' 6"  (1.676 m)     Head Circumference --      Peak Flow --      Pain Score 08/27/17 1809 5     Pain Loc --      Pain Edu? --      Excl. in [place]? --     Constitutional: Alert and oriented. Well appearing and in no distress. Head: Normocephalic and atraumatic. Cardiovascular: Normal rate, regular rhythm. Normal distal  pulses. Respiratory: Normal respiratory effort.  Musculoskeletal: Right hand without obvious deformity, dislocation, or effusion.  Patient with some subtle soft tissue swelling to the left dorsal lateral hand first interspace.  The area is tender to palpation but no overlying skin changes are appreciated.  Patient is able to demonstrate normal composite fist.  Nontender with normal range of motion in all extremities.  Neurologic: Cranial nerves II through XII grossly intact.  Normal gross sensation.  Normal intrinsic and opposition testing to the left hand.  Normal gait without ataxia. Normal speech and language. No gross focal neurologic deficits are appreciated. Skin:  Skin is warm, dry and intact. No rash noted. ___________________________________________   RADIOLOGY  Left Hand  Negative ____________________________________________  PROCEDURES  Procedures Ace bandage ____________________________________________  INITIAL IMPRESSION / ASSESSMENT AND PLAN / ED COURSE  Patient with ED evaluation of subtle swelling to the dorsal left hand consistent with a likely sprain and underlying hematoma.  Patient is unaware of any preceding injury by contact.  Her exam is otherwise benign and her x-ray is reassuring that shows no underlying bony injury.  She will be placed in Ace bandage for comfort and is encouraged to apply ice to reduce swelling.  She will take over-the-counter ibuprofen as needed for pain relief.  She will follow-up with primary provider return to the ED as needed. ____________________________________________  FINAL CLINICAL IMPRESSION(S) / ED DIAGNOSES  Final diagnoses:  Contusion of left hand, initial encounter      Melvenia Needles, PA-C 08/27/17 1958    Delman Kitten, MD 08/28/17 1040

## 2017-08-27 NOTE — Discharge Instructions (Signed)
Your exam and x-ray are negative for a fracture or dislocation. Keep the hand protected and apply ice to reduce swelling. Take ibuprofen over-the-counter as needed.

## 2017-08-27 NOTE — ED Triage Notes (Signed)
Swelling and redness to left hand x 1 hour.

## 2017-09-08 ENCOUNTER — Encounter: Payer: Self-pay | Admitting: Emergency Medicine

## 2017-09-08 ENCOUNTER — Emergency Department
Admission: EM | Admit: 2017-09-08 | Discharge: 2017-09-08 | Disposition: A | Discharge status: Home / Self Care | Payer: Self-pay | Attending: Emergency Medicine | Admitting: Emergency Medicine

## 2017-09-08 ENCOUNTER — Other Ambulatory Visit: Payer: Self-pay

## 2017-09-08 DIAGNOSIS — Z79899 Other long term (current) drug therapy: Secondary | ICD-10-CM | POA: Insufficient documentation

## 2017-09-08 DIAGNOSIS — I10 Essential (primary) hypertension: Secondary | ICD-10-CM | POA: Insufficient documentation

## 2017-09-08 DIAGNOSIS — Z760 Encounter for issue of repeat prescription: Secondary | ICD-10-CM | POA: Insufficient documentation

## 2017-09-08 MED ORDER — VERAPAMIL HCL 80 MG PO TABS: 80.0000 mg | ORAL_TABLET | Freq: Every day | ORAL | 1 refills | Status: DC

## 2017-09-08 MED ORDER — LOSARTAN POTASSIUM 50 MG PO TABS: 50.0000 mg | ORAL_TABLET | Freq: Every day | ORAL | 1 refills | Status: DC

## 2017-09-08 NOTE — ED Provider Notes (Signed)
[facility] Emergency Department Provider Note  ____________________________________________  Time seen: Approximately 8:16 AM  I have reviewed the triage vital signs and the nursing notes.   HISTORY  Chief Complaint Hypertension    HPI Christine Chase is a 52 y.o. female that presents emergency department for medication refill.  Patient ran out of verapamil yesterday and has 1 week of losartan left.  She takes 80 mg of verapamil once a day and 50 mg of losartan once a day.  She has been on these medications for years and denies any recent dosage changes.  No complications with medications.  She states that she is in between switching primary care providers.  No shortness of breath, chest pain.  Past Medical History:  Diagnosis Date  . DVT of upper extremity (deep vein thrombosis) ([place])   . Hypertension     There are no active problems to display for this patient.   Past Surgical History:  Procedure Laterality Date  . MYOMECTOMY      Prior to Admission medications   Medication Sig Start Date End Date Taking? Authorizing Provider  albuterol (PROVENTIL HFA;VENTOLIN HFA) 108 (90 Base) MCG/ACT inhaler Inhale 2 puffs into the lungs every 6 (six) hours as needed for wheezing or shortness of breath. 02/18/17   Laban Emperor, PA-C  azithromycin (ZITHROMAX Z-PAK) 250 MG tablet Take 2 tablets (500 mg) on  Day 1,  followed by 1 tablet (250 mg) once daily on Days 2 through 5. 02/18/17   Laban Emperor, PA-C  brompheniramine-pseudoephedrine-DM 30-2-10 MG/5ML syrup Take 5 mLs by mouth 4 (four) times daily as needed. 02/14/17   Sable Feil, PA-C  ibuprofen (ADVIL,MOTRIN) 600 MG tablet Take 1 tablet (600 mg total) by mouth every 8 (eight) hours as needed. 02/14/17   Sable Feil, PA-C  losartan (COZAAR) 50 MG tablet Take 1 tablet (50 mg total) by mouth daily. 09/08/17   Laban Emperor, PA-C  predniSONE (DELTASONE) 10 MG tablet Take 6 tablets on day 1, take 5 tablets on  day 2, take 4 tablets on day 3, take 3 tablets on day 4, take 2 tablets on day 5, take 1 tablet on day 6 02/18/17   Laban Emperor, PA-C  promethazine (PHENERGAN) 25 MG tablet Take 1 tablet (25 mg total) by mouth every 8 (eight) hours as needed for nausea or vomiting. 01/23/17   Menshew, Dannielle Karvonen, PA-C  verapamil (CALAN) 80 MG tablet Take 1 tablet (80 mg total) by mouth daily. 09/08/17 09/08/18  Laban Emperor, PA-C    Allergies Patient has no known allergies.  Family History  Problem Relation Age of Onset  . Hypertension Mother   . COPD Father   . Heart disease Father     Social History Social History   Tobacco Use  . Smoking status: Never Smoker  . Smokeless tobacco: Never Used  Substance Use Topics  . Alcohol use: No  . Drug use: No     Review of Systems  Cardiovascular: No chest pain. Respiratory:  No SOB. Gastrointestinal: No nausea, no vomiting.  Musculoskeletal: Negative for musculoskeletal pain. Skin: Negative for rash, abrasions, lacerations, ecchymosis.   ____________________________________________   PHYSICAL EXAM:  VITAL SIGNS: ED Triage Vitals  Enc Vitals Group     BP 09/08/17 0740 (!) 162/90     Pulse Rate 09/08/17 0740 71     Resp 09/08/17 0740 18     Temp 09/08/17 0740 98.1 F (36.7 C)     Temp Source 09/08/17  0740 Oral     SpO2 09/08/17 0740 97 %     Weight 09/08/17 0737 165 lb (74.8 kg)     Height 09/08/17 0737 5\' 5"  (1.651 m)     Head Circumference --      Peak Flow --      Pain Score 09/08/17 0737 0     Pain Loc --      Pain Edu? --      Excl. in [place]? --      Constitutional: Alert and oriented. Well appearing and in no acute distress. Eyes: Conjunctivae are normal. PERRL. EOMI. Head: Atraumatic. ENT:      Ears:      Nose: No congestion/rhinnorhea.      Mouth/Throat: Mucous membranes are moist.  Neck: No stridor. Cardiovascular: Normal rate, regular rhythm.  Good peripheral circulation. Respiratory: Normal respiratory effort  without tachypnea or retractions. Lungs CTAB. Good air entry to the bases with no decreased or absent breath sounds. Musculoskeletal: Full range of motion to all extremities. No gross deformities appreciated. Neurologic:  Normal speech and language. No gross focal neurologic deficits are appreciated.  Skin:  Skin is warm, dry and intact. No rash noted. Psychiatric: Mood and affect are normal. Speech and behavior are normal. Patient exhibits appropriate insight and judgement.   ____________________________________________   LABS (all labs ordered are listed, but only abnormal results are displayed)  Labs Reviewed - No data to display ____________________________________________  EKG   ____________________________________________  RADIOLOGY   No results found.  ____________________________________________    PROCEDURES  Procedure(s) performed:    Procedures    Medications - No data to display   ____________________________________________   INITIAL IMPRESSION / ASSESSMENT AND PLAN / ED COURSE  Pertinent labs & imaging results that were available during my care of the patient were reviewed by me and considered in my medical decision making (see chart for details).  Review of the [place] CSRS was performed in accordance of the [place] prior to dispensing any controlled drugs.     Patient presented to the emergency department for medication refill.  Vital signs and exam are reassuring.  Losartan and verapamil were refilled until patient can follow-up with primary care.  Patient will be discharged home with prescriptions for verapamil and losartan. Patient is to follow up with primary care as directed. Patient is given ED precautions to return to the ED for any worsening or new symptoms.     ____________________________________________  FINAL CLINICAL IMPRESSION(S) / ED DIAGNOSES  Final diagnoses:  Hypertension, unspecified type  Encounter for medication refill       NEW MEDICATIONS STARTED DURING THIS VISIT:  ED Discharge Orders         Ordered    losartan (COZAAR) 50 MG tablet  Daily     09/08/17 0808    verapamil (CALAN) 80 MG tablet  Daily     09/08/17 0808              This chart was dictated using voice recognition software/Dragon. Despite best efforts to proofread, errors can occur which can change the meaning. Any change was purely unintentional.    Laban Emperor, PA-C 09/08/17 1508    Nena Polio, MD 09/08/17 [phone]

## 2017-09-08 NOTE — ED Notes (Signed)
Patient states "I am just here to get blood pressure medicine refilled" Patient denies CP, SOB, or any symptoms at this time

## 2017-09-08 NOTE — ED Triage Notes (Addendum)
Pt to ed with need for refill on BP meds.  Pt states she needs verapamil and losartan refills.  Reports last dose verapamil was yesterday and then has 1 week supply of losartan left.  Denies Cp, denies sob.

## 2018-02-04 ENCOUNTER — Encounter: Payer: Self-pay | Admitting: Emergency Medicine

## 2018-02-04 ENCOUNTER — Emergency Department
Admission: EM | Admit: 2018-02-04 | Discharge: 2018-02-04 | Disposition: A | Discharge status: Home / Self Care | Payer: Self-pay | Attending: Emergency Medicine | Admitting: Emergency Medicine

## 2018-02-04 ENCOUNTER — Other Ambulatory Visit: Payer: Self-pay

## 2018-02-04 DIAGNOSIS — J111 Influenza due to unidentified influenza virus with other respiratory manifestations: Secondary | ICD-10-CM | POA: Insufficient documentation

## 2018-02-04 DIAGNOSIS — Z79899 Other long term (current) drug therapy: Secondary | ICD-10-CM | POA: Insufficient documentation

## 2018-02-04 DIAGNOSIS — I10 Essential (primary) hypertension: Secondary | ICD-10-CM | POA: Insufficient documentation

## 2018-02-04 MED ORDER — GUAIFENESIN-CODEINE 100-10 MG/5ML PO SOLN: 5.0000 mL | Freq: Four times a day (QID) | ORAL | 0 refills | Status: DC | PRN

## 2018-02-04 MED ORDER — OSELTAMIVIR PHOSPHATE 75 MG PO CAPS: 75.0000 mg | ORAL_CAPSULE | Freq: Two times a day (BID) | ORAL | 0 refills | Status: AC

## 2018-02-04 NOTE — ED Notes (Signed)
See triage note  States she developed generalized body aches and low grade fever last pm  Afebrile on arrival   But states she has been exposed to flu

## 2018-02-04 NOTE — Discharge Instructions (Addendum)
Follow-up with your primary care provider or can no clinic acute care if any continued problems.  Continue taking Tylenol or ibuprofen as needed for fever, body aches or headache.  Increase fluids.  Begin taking Tamiflu twice daily for the next 5 days.  Robitussin-AC for cough.  This medication has a narcotic and it do not drive or operate machinery while taking this medication.

## 2018-02-04 NOTE — ED Provider Notes (Signed)
[facility] Emergency Department Provider Note   ____________________________________________   First MD Initiated Contact with Patient 02/04/18 1016     (approximate)  I have reviewed the triage vital signs and the nursing notes.   HISTORY  Chief Complaint Generalized Body Aches and Cough   HPI Christine Chase is a 53 y.o. female   presents today with complaint of sudden onset of generalized body aches and low-grade fever that started last evening.  Patient states that she has been exposed to the flu.  She also has a cough and ear pain.  Patient took Tylenol this morning.  She denies any vomiting or diarrhea.  She rates her pain as 6/10.   Past Medical History:  Diagnosis Date  . DVT of upper extremity (deep vein thrombosis) ([place])   . Hypertension     There are no active problems to display for this patient.   Past Surgical History:  Procedure Laterality Date  . MYOMECTOMY      Prior to Admission medications   Medication Sig Start Date End Date Taking? Authorizing Provider  albuterol (PROVENTIL HFA;VENTOLIN HFA) 108 (90 Base) MCG/ACT inhaler Inhale 2 puffs into the lungs every 6 (six) hours as needed for wheezing or shortness of breath. 02/18/17   Laban Emperor, PA-C  guaiFENesin-codeine 100-10 MG/5ML syrup Take 5 mLs by mouth every 6 (six) hours as needed. 02/04/18   Johnn Hai, PA-C  losartan (COZAAR) 50 MG tablet Take 1 tablet (50 mg total) by mouth daily. 09/08/17   Laban Emperor, PA-C  oseltamivir (TAMIFLU) 75 MG capsule Take 1 capsule (75 mg total) by mouth 2 (two) times daily for 5 days. 02/04/18 02/09/18  Johnn Hai, PA-C  verapamil (CALAN) 80 MG tablet Take 1 tablet (80 mg total) by mouth daily. 09/08/17 09/08/18  Laban Emperor, PA-C    Allergies Patient has no known allergies.  Family History  Problem Relation Age of Onset  . Hypertension Mother   . COPD Father   . Heart disease Father     Social History Social  History   Tobacco Use  . Smoking status: Never Smoker  . Smokeless tobacco: Never Used  Substance Use Topics  . Alcohol use: No  . Drug use: No    Review of Systems Constitutional: Positive fever/chills Eyes: No visual changes. ENT: Positive ear pain.  Negative for throat pain. Cardiovascular: Denies chest pain. Respiratory: Denies shortness of breath. Gastrointestinal: No abdominal pain.  No nausea, no vomiting.  No diarrhea.  Musculoskeletal: Positive body aches. Skin: Negative for rash. Neurological: Negative for headaches, focal weakness or numbness. ____________________________________________   PHYSICAL EXAM:  VITAL SIGNS: ED Triage Vitals  Enc Vitals Group     BP --      Pulse Rate 02/04/18 0932 91     Resp 02/04/18 0932 16     Temp 02/04/18 0932 98.5 F (36.9 C)     Temp Source 02/04/18 0932 Oral     SpO2 02/04/18 0932 96 %     Weight 02/04/18 0933 167 lb (75.8 kg)     Height 02/04/18 0933 5\' 5"  (1.651 m)     Head Circumference --      Peak Flow --      Pain Score 02/04/18 0933 6     Pain Loc --      Pain Edu? --      Excl. in [place]? --    Constitutional: Alert and oriented. Well appearing and in no acute distress.  Eyes: Conjunctivae are normal.  Head: Atraumatic. Nose: No congestion/rhinnorhea.  EACs and TMs are clear bilaterally. Mouth/Throat: Mucous membranes are moist.  Oropharynx non-erythematous. Neck: No stridor.   Hematological/Lymphatic/Immunilogical: No cervical lymphadenopathy. Cardiovascular: Normal rate, regular rhythm. Grossly normal heart sounds.  Good peripheral circulation. Respiratory: Normal respiratory effort.  No retractions. Lungs CTAB. Gastrointestinal: Soft and nontender. No distention.  Musculoskeletal: No lower extremity tenderness nor edema.  No joint effusions. Neurologic:  Normal speech and language. No gross focal neurologic deficits are appreciated. No gait instability. Skin:  Skin is warm, dry and intact. No rash  noted. Psychiatric: Mood and affect are normal. Speech and behavior are normal.  ____________________________________________   LABS (all labs ordered are listed, but only abnormal results are displayed)  Labs Reviewed - No data to display  PROCEDURES  Procedure(s) performed: None  Procedures  Critical Care performed: No  ____________________________________________   INITIAL IMPRESSION / ASSESSMENT AND PLAN / ED COURSE  As part of my medical decision making, I reviewed the following data within the electronic MEDICAL RECORD NUMBER Notes from prior ED visits and [place] Controlled Substance Database  Patient presents to the ED with complaint of sudden onset of generalized body aches and low-grade fever that started last night.  She has been exposed to the flu and did not get the flu vaccine.  Patient has been taking Tylenol to control her fever.  Patient was given a prescription for Tamiflu and Robitussin-AC.  She is encouraged to drink fluids and follow-up with her PCP if any continued problems. ____________________________________________   FINAL CLINICAL IMPRESSION(S) / ED DIAGNOSES  Final diagnoses:  Influenza     ED Discharge Orders         Ordered    guaiFENesin-codeine 100-10 MG/5ML syrup  Every 6 hours PRN,   Status:  Discontinued     02/04/18 1024    oseltamivir (TAMIFLU) 75 MG capsule  2 times daily     02/04/18 1024    guaiFENesin-codeine 100-10 MG/5ML syrup  Every 6 hours PRN     02/04/18 1025           Note:  This document was prepared using Dragon voice recognition software and may include unintentional dictation errors.    Johnn Hai, PA-C 02/04/18 1645    Arta Silence, MD 02/05/18 [phone]

## 2018-02-04 NOTE — ED Triage Notes (Signed)
C/O cough, body aches, and ear pain x 1 day.  Tylenol last taken this morning at 0400

## 2018-09-23 ENCOUNTER — Other Ambulatory Visit: Payer: Self-pay

## 2018-09-23 ENCOUNTER — Emergency Department
Admission: EM | Admit: 2018-09-23 | Discharge: 2018-09-23 | Disposition: A | Discharge status: Home / Self Care | Payer: Self-pay | Attending: Emergency Medicine | Admitting: Emergency Medicine

## 2018-09-23 ENCOUNTER — Encounter: Payer: Self-pay | Admitting: Emergency Medicine

## 2018-09-23 DIAGNOSIS — I10 Essential (primary) hypertension: Secondary | ICD-10-CM | POA: Insufficient documentation

## 2018-09-23 DIAGNOSIS — Z79899 Other long term (current) drug therapy: Secondary | ICD-10-CM | POA: Insufficient documentation

## 2018-09-23 DIAGNOSIS — J029 Acute pharyngitis, unspecified: Secondary | ICD-10-CM | POA: Insufficient documentation

## 2018-09-23 LAB — GROUP A STREP BY PCR: Group A Strep by PCR: NOT DETECTED

## 2018-09-23 MED ORDER — AMOXICILLIN 500 MG PO CAPS: 500.0000 mg | ORAL_CAPSULE | Freq: Three times a day (TID) | ORAL | 0 refills | Status: DC

## 2018-09-23 MED ORDER — LIDOCAINE VISCOUS HCL 2 % MT SOLN: 5.0000 mL | Freq: Four times a day (QID) | OROMUCOSAL | 0 refills | Status: DC | PRN

## 2018-09-23 MED ORDER — PROMETHAZINE-DM 6.25-15 MG/5ML PO SYRP: 5.0000 mL | ORAL_SOLUTION | Freq: Four times a day (QID) | ORAL | 0 refills | Status: DC | PRN

## 2018-09-23 NOTE — ED Triage Notes (Signed)
Sore throat since Friday, denies cough, NAD.

## 2018-09-23 NOTE — ED Notes (Signed)
See triage note   Presents with sore throat since Friday  Pain increases with swallowing  Afebrile on arrival  Throat is red and swollen

## 2018-09-23 NOTE — ED Notes (Signed)
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

## 2018-09-23 NOTE — ED Provider Notes (Signed)
[facility] Emergency Department Provider Note   ____________________________________________   First MD Initiated Contact with Patient 09/23/18 [phone]     (approximate)  I have reviewed the triage vital signs and the nursing notes.   HISTORY  Chief Complaint Sore Throat    HPI Christine Chase is a 53 y.o. female patient claims sore throat for the 3 days.  Patient is a positive exposure to strep from a child.  Patient state can tolerate food and fluids with mild discomfort.  Patient rates her pain discomfort as 8/10.  Patient describes her pain as "sore".  Patient took 2 Goody powders 1 hour prior to arrival secondary to headache.  Patient denies nasal congestion or runny nose.  Patient states l right ear pressure for hearing loss.         Past Medical History:  Diagnosis Date  . DVT of upper extremity (deep vein thrombosis) ([place])   . Hypertension     There are no active problems to display for this patient.   Past Surgical History:  Procedure Laterality Date  . MYOMECTOMY      Prior to Admission medications   Medication Sig Start Date End Date Taking? Authorizing Provider  albuterol (PROVENTIL HFA;VENTOLIN HFA) 108 (90 Base) MCG/ACT inhaler Inhale 2 puffs into the lungs every 6 (six) hours as needed for wheezing or shortness of breath. 02/18/17   Laban Emperor, PA-C  amoxicillin (AMOXIL) 500 MG capsule Take 1 capsule (500 mg total) by mouth 3 (three) times daily. 09/23/18   Sable Feil, PA-C  lidocaine (XYLOCAINE) 2 % solution Use as directed 5 mLs in the mouth or throat every 6 (six) hours as needed for mouth pain. Mix with 5 mL of Phenergan DM for swish and swallow. 09/23/18   Sable Feil, PA-C  losartan (COZAAR) 50 MG tablet Take 1 tablet (50 mg total) by mouth daily. 09/08/17   Laban Emperor, PA-C  promethazine-dextromethorphan (PROMETHAZINE-DM) 6.25-15 MG/5ML syrup Take 5 mLs by mouth 4 (four) times daily as needed for cough. Mix with 5  mL of viscous lidocaine for swish and swallow. 09/23/18   Sable Feil, PA-C  verapamil (CALAN) 80 MG tablet Take 1 tablet (80 mg total) by mouth daily. 09/08/17 09/08/18  Laban Emperor, PA-C    Allergies Patient has no known allergies.  Family History  Problem Relation Age of Onset  . Hypertension Mother   . COPD Father   . Heart disease Father     Social History Social History   Tobacco Use  . Smoking status: Never Smoker  . Smokeless tobacco: Never Used  Substance Use Topics  . Alcohol use: No  . Drug use: No    Review of Systems Constitutional: No fever/chills Eyes: No visual changes. ENT: Sore throat.   Cardiovascular: Denies chest pain. Respiratory: Denies shortness of breath. Gastrointestinal: No abdominal pain.  No nausea, no vomiting.  No diarrhea.  No constipation. Genitourinary: Negative for dysuria. Musculoskeletal: Negative for back pain. Skin: Negative for rash. Neurological: Negative for headaches, focal weakness or numbness. Endocrine:  Hypertension ___________________________________________   PHYSICAL EXAM:  VITAL SIGNS: ED Triage Vitals  Enc Vitals Group     BP 09/23/18 0850 (!) 162/103     Pulse Rate 09/23/18 0850 85     Resp 09/23/18 0850 16     Temp 09/23/18 0850 97.6 F (36.4 C)     Temp Source 09/23/18 0850 Oral     SpO2 09/23/18 0850 99 %  Weight 09/23/18 0848 171 lb (77.6 kg)     Height 09/23/18 0848 5\' 5"  (1.651 m)     Head Circumference --      Peak Flow --      Pain Score 09/23/18 0848 8     Pain Loc --      Pain Edu? --      Excl. in [place]? --     Constitutional: Alert and oriented. Well appearing and in no acute distress. Eyes: Conjunctivae are normal. PERRL. EOMI. Head: Atraumatic. Nose: No congestion/rhinnorhea. Mouth/Throat: Mucous membranes are moist.  Oropharynx erythematous. Neck: No stridor.  Hematological/Lymphatic/Immunilogical: No cervical lymphadenopathy. Cardiovascular: Normal rate, regular rhythm.  Grossly normal heart sounds.  Good peripheral circulation.  Elevated blood pressure. Respiratory: Normal respiratory effort.  No retractions. Lungs CTAB. Skin:  Skin is warm, dry and intact. No rash noted. Psychiatric: Mood and affect are normal. Speech and behavior are normal.  ____________________________________________   LABS (all labs ordered are listed, but only abnormal results are displayed)  Labs Reviewed  GROUP A STREP BY PCR   ____________________________________________  EKG   ____________________________________________  RADIOLOGY  ED MD interpretation:    Official radiology report(s): No results found.  ____________________________________________   PROCEDURES  Procedure(s) performed (including Critical Care):  Procedures   ____________________________________________   INITIAL IMPRESSION / ASSESSMENT AND PLAN / ED COURSE  As part of my medical decision making, I reviewed the following data within the [place] was evaluated in Emergency Department on 09/23/2018 for the symptoms described in the history of present illness. She was evaluated in the context of the global COVID-19 pandemic, which necessitated consideration that the patient might be at risk for infection with the SARS-CoV-2 virus that causes COVID-19. Institutional protocols and algorithms that pertain to the evaluation of patients at risk for COVID-19 are in a state of rapid change based on information released by regulatory bodies including the CDC and federal and state organizations. These policies and algorithms were followed during the patient's care in the ED.  Patient presents with 3 days of sore throat.  Exam consistent with pharyngitis.  Patient given discharge care instruction and a work note.  Patient vies follow [facility] if condition persist.        ____________________________________________   FINAL CLINICAL  IMPRESSION(S) / ED DIAGNOSES  Final diagnoses:  Pharyngitis, unspecified etiology     ED Discharge Orders         Ordered    amoxicillin (AMOXIL) 500 MG capsule  3 times daily     09/23/18 1030    lidocaine (XYLOCAINE) 2 % solution  Every 6 hours PRN     09/23/18 1030    promethazine-dextromethorphan (PROMETHAZINE-DM) 6.25-15 MG/5ML syrup  4 times daily PRN     09/23/18 1030           Note:  This document was prepared using Dragon voice recognition software and may include unintentional dictation errors.    Sable Feil, PA-C 09/23/18 1032    Earleen Newport, MD 09/23/18 [phone]

## 2018-09-24 ENCOUNTER — Telehealth: Payer: Self-pay | Admitting: Nurse Practitioner

## 2018-09-24 DIAGNOSIS — J020 Streptococcal pharyngitis: Secondary | ICD-10-CM

## 2018-09-24 NOTE — Progress Notes (Signed)
Looks like you were just seen in the ED yesterday and were given amoxicillin. Spoke with on phone. Patient says that she is still sick despite taking amoxicillin. Temp is 99.8 . 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 She actually just needs a work note to be out till Thursday. The ER doctor only wrote her out for today.  We are sorry that you are not feeling well.  Here is how we plan to help!  Continue all amoxicillin rx by ER.   Home Care:  Only take medications as instructed by your medical team.  Complete the entire course of an antibiotic.  Do not take these medications with alcohol.  A steam or ultrasonic humidifier can help congestion.  You can place a towel over your head and breathe in the steam from hot water coming from a faucet.  Avoid close contacts especially the very young and the elderly.  Cover your mouth when you cough or sneeze.  Always remember to wash your hands.  Get Help Right Away If:  You develop worsening fever or sinus pain.  You develop a severe head ache or visual changes.  Your symptoms persist after you have completed your treatment plan.  Make sure you  Understand these instructions.  Will watch your condition.  Will get help right away if you are not doing well or get worse.  Your e-visit answers were reviewed by a board certified advanced clinical practitioner to complete your personal care plan.  Depending on the condition, your plan could have included both over the counter or prescription medications.  If there is a problem please reply  once you have received a response from your provider.  Your safety is important to [place].  If you have drug allergies check your prescription carefully.    You can use MyChart to ask questions about today's visit, request a non-urgent call back, or ask for a work or school excuse for 24 hours related to this e-Visit. If it has  been greater than 24 hours you will need to follow up with your provider, or enter a new e-Visit to address those concerns.  You will get an e-mail in the next two days asking about your experience.  I hope that your e-visit has been valuable and will speed your recovery. Thank you for using e-visits.     5-10 minutes spent reviewing and documenting in chart.

## 2018-09-24 NOTE — Progress Notes (Deleted)

## 2018-12-25 ENCOUNTER — Emergency Department: Payer: Medicaid Other

## 2018-12-25 ENCOUNTER — Other Ambulatory Visit: Payer: Self-pay

## 2018-12-25 ENCOUNTER — Ambulatory Visit: Payer: Self-pay | Admitting: *Deleted

## 2018-12-25 ENCOUNTER — Emergency Department
Admission: EM | Admit: 2018-12-25 | Discharge: 2018-12-25 | Disposition: A | Discharge status: Home / Self Care | Payer: Medicaid Other | Attending: Emergency Medicine | Admitting: Emergency Medicine

## 2018-12-25 ENCOUNTER — Encounter: Payer: Self-pay | Admitting: Intensive Care

## 2018-12-25 DIAGNOSIS — R0602 Shortness of breath: Secondary | ICD-10-CM

## 2018-12-25 DIAGNOSIS — I10 Essential (primary) hypertension: Secondary | ICD-10-CM | POA: Insufficient documentation

## 2018-12-25 DIAGNOSIS — Z79899 Other long term (current) drug therapy: Secondary | ICD-10-CM | POA: Insufficient documentation

## 2018-12-25 HISTORY — DX: COVID-19: U07.1

## 2018-12-25 MED ORDER — METHYLPREDNISOLONE 4 MG PO TBPK: ORAL_TABLET | ORAL | 0 refills | Status: DC

## 2018-12-25 MED ORDER — ALBUTEROL SULFATE HFA 108 (90 BASE) MCG/ACT IN AERS: 2.0000 | INHALATION_SPRAY | Freq: Four times a day (QID) | RESPIRATORY_TRACT | 0 refills | Status: DC | PRN

## 2018-12-25 NOTE — ED Notes (Signed)
E-signature pad not working.  Patient verbalized understanding of discharge instructions.  Denies further questions.

## 2018-12-25 NOTE — Telephone Encounter (Signed)
Pt reports was directed to call [facility] by [facility]. States HD recommended chest xray. Pt reports covid positive, 10 days out. Reports SOB with minimal exertion, talking.Reports chest tightness.Worsening 2 days ago. Also reports diaphoresis at those times. Pt sob with talking during call, speech halting. Advised ED.  NT attempted to reach ED at [facility], connected to main [facility] number then disconnected x 2. Advised pt to wear mask, alert first health care provider she encounters she is covid positive. Pt verbalizes understanding.  Reason for Disposition . [1] MILD difficulty breathing (e.g., minimal/no SOB at rest, SOB with walking, pulse <100) AND [2] NEW-onset or WORSE than normal  Answer Assessment - Initial Assessment Questions 1. RESPIRATORY STATUS: "Describe your breathing?" (e.g., wheezing, shortness of breath, unable to speak, severe coughing)      SOB with exertion 2. ONSET: "When did this breathing problem begin?"      2 days ago 3. PATTERN "Does the difficult breathing come and go, or has it been constant since it started?"      With exertion 4. SEVERITY: "How bad is your breathing?" (e.g., mild, moderate, severe)    - MILD: No SOB at rest, mild SOB with walking, speaks normally in sentences, can lay down, no retractions, pulse < 100.    - MODERATE: SOB at rest, SOB with minimal exertion and prefers to sit, cannot lie down flat, speaks in phrases, mild retractions, audible wheezing, pulse 100-120.    - SEVERE: Very SOB at rest, speaks in single words, struggling to breathe, sitting hunched forward, retractions, pulse > 120      With exertion only 5. RECURRENT SYMPTOM: "Have you had difficulty breathing before?" If so, ask: "When was the last time?" and "What happened that time?"      no 6. CARDIAC HISTORY: "Do you have any history of heart disease?" (e.g., heart attack, angina, bypass surgery, angioplasty)     no 7. LUNG HISTORY: "Do you have any history of lung disease?"  (e.g.,  pulmonary embolus, asthma, emphysema)     no 8. CAUSE: "What do you think is causing the breathing problem?"      Covid positive 10 days from test date 9. OTHER SYMPTOMS: "Do you have any other symptoms? (e.g., dizziness, runny nose, cough, chest pain, fever)     Sweating, dry cough  Protocols used: BREATHING DIFFICULTY-A-AH

## 2018-12-25 NOTE — ED Provider Notes (Signed)
[facility] Emergency Department Provider Note   ____________________________________________   First MD Initiated Contact with Patient 12/25/18 1310     (approximate)  I have reviewed the triage vital signs and the nursing notes.   HISTORY  Chief Complaint Shortness of Breath    HPI Christine Chase is a 53 y.o. female patient complaining of shortness of breath with exertion while exertion.  Patient states diagnosed with COVID-19 on 12/16/2018.  Patient had a telemedicine visit today and was advised to come to ED with chest x-ray.  Patient appears in no acute distress.  No palliative measure for complaint.  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  Patient states he is continue on Xarelto per advice of cardiologist last year.  Patient is asymptomatic with dyspnea until diagnosed with COVID-19.         Past Medical History:  Diagnosis Date  . COVID-19   . DVT of upper extremity (deep vein thrombosis) ([place])   . Hypertension     There are no problems to display for this patient.   Past Surgical History:  Procedure Laterality Date  . MYOMECTOMY      Prior to Admission medications   Medication Sig Start Date End Date Taking? Authorizing Provider  albuterol (PROVENTIL HFA;VENTOLIN HFA) 108 (90 Base) MCG/ACT inhaler Inhale 2 puffs into the lungs every 6 (six) hours as needed for wheezing or shortness of breath. 02/18/17   Laban Emperor, PA-C  albuterol (VENTOLIN HFA) 108 (90 Base) MCG/ACT inhaler Inhale 2 puffs into the lungs every 6 (six) hours as needed for wheezing or shortness of breath. 12/25/18   Sable Feil, PA-C  amoxicillin (AMOXIL) 500 MG capsule Take 1 capsule (500 mg total) by mouth 3 (three) times daily. 09/23/18   Sable Feil, PA-C  lidocaine (XYLOCAINE) 2 % solution Use as directed 5 mLs in the mouth or throat every 6 (six) hours as needed for mouth pain. Mix with 5 mL of Phenergan DM for swish and swallow.  09/23/18   Sable Feil, PA-C  losartan (COZAAR) 50 MG tablet Take 1 tablet (50 mg total) by mouth daily. 09/08/17   Laban Emperor, PA-C  methylPREDNISolone (MEDROL DOSEPAK) 4 MG TBPK tablet Take Tapered dose as directed 12/25/18   Sable Feil, PA-C  promethazine-dextromethorphan (PROMETHAZINE-DM) 6.25-15 MG/5ML syrup Take 5 mLs by mouth 4 (four) times daily as needed for cough. Mix with 5 mL of viscous lidocaine for swish and swallow. 09/23/18   Sable Feil, PA-C  verapamil (CALAN) 80 MG tablet Take 1 tablet (80 mg total) by mouth daily. 09/08/17 09/08/18  Laban Emperor, PA-C    Allergies Patient has no known allergies.  Family History  Problem Relation Age of Onset  . Hypertension Mother   . COPD Father   . Heart disease Father     Social History Social History   Tobacco Use  . Smoking status: Never Smoker  . Smokeless tobacco: Never Used  Substance Use Topics  . Alcohol use: No  . Drug use: No    Review of Systems Constitutional: No fever/chills Eyes: No visual changes. ENT: No sore throat. Cardiovascular: Denies chest pain. Respiratory:  shortness of breath. Gastrointestinal: No abdominal pain.  No nausea, no vomiting.  No diarrhea.  No constipation. Genitourinary: Negative for dysuria. Musculoskeletal: Negative for back pain. Skin: Negative for rash. Neurological: Negative for headaches, focal weakness or numbness. Endocrine:  Hypertension.   ____________________________________________   PHYSICAL EXAM:  VITAL SIGNS: ED Triage Vitals  Enc Vitals Group     BP 12/25/18 1253 (!) 143/83     Pulse Rate 12/25/18 1253 97     Resp 12/25/18 1253 16     Temp 12/25/18 1253 98.4 F (36.9 C)     Temp Source 12/25/18 1253 Oral     SpO2 12/25/18 1252 96 %     Weight 12/25/18 1253 171 lb (77.6 kg)     Height 12/25/18 1253 5\' 5"  (1.651 m)     Head Circumference --      Peak Flow --      Pain Score 12/25/18 1253 0     Pain Loc --      Pain Edu? --      Excl.  in [place]? --     Constitutional: Alert and oriented. Well appearing and in no acute distress. Eyes: Conjunctivae are normal. PERRL. EOMI. Head: Atraumatic. Nose: No congestion/rhinnorhea. Mouth/Throat: Mucous membranes are moist.  Oropharynx non-erythematous. Neck: No stridor.   Hematological/Lymphatic/Immunilogical: No cervical lymphadenopathy. Cardiovascular: Normal rate, regular rhythm. Grossly normal heart sounds.  Good peripheral circulation. Respiratory: Normal respiratory effort.  No retractions. Lungs CTAB. Neurologic:  Normal speech and language. No gross focal neurologic deficits are appreciated. No gait instability. Skin:  Skin is warm, dry and intact. No rash noted. Psychiatric: Mood and affect are normal. Speech and behavior are normal.  ____________________________________________   LABS (all labs ordered are listed, but only abnormal results are displayed)  Labs Reviewed - No data to display ____________________________________________  EKG   ____________________________________________  RADIOLOGY  ED MD interpretation:    Official radiology report(s): DG Chest 2 View  Result Date: 12/25/2018 CLINICAL DATA:  Shortness of breath.  COVID positive EXAM: CHEST - 2 VIEW COMPARISON:  02/18/2017 FINDINGS: Heart and mediastinal contours are within normal limits. No focal opacities or effusions. No acute bony abnormality. IMPRESSION: No active cardiopulmonary disease. Electronically Signed   By: Rolm Baptise M.D.   On: 12/25/2018 13:14    ____________________________________________   PROCEDURES  Procedure(s) performed (including Critical Care):  Procedures   ____________________________________________   INITIAL IMPRESSION / ASSESSMENT AND PLAN / ED COURSE  As part of my medical decision making, I reviewed the following data within the [place]     Patient presents with complaint of dyspnea after diagnosed with COVID-19 on 12/16/2018.   Patient with dyspnea on exertion.  Was unable to find a drop in her oxygen level with pulse ox in ambulation.  Patient states she feels"winded "after walking.  Discussed negative chest x-ray findings with patient.  Patient given discharge care instruction work note.  Patient given prescription for prednisone and Proventil inhaler.  Patient [facility].    Christine Chase was evaluated in Emergency Department on 12/25/2018 for the symptoms described in the history of present illness. She was evaluated in the context of the global COVID-19 pandemic, which necessitated consideration that the patient might be at risk for infection with the SARS-CoV-2 virus that causes COVID-19. Institutional protocols and algorithms that pertain to the evaluation of patients at risk for COVID-19 are in a state of rapid change based on information released by regulatory bodies including the CDC and federal and state organizations. These policies and algorithms were followed during the patient's care in the ED.       ____________________________________________   FINAL CLINICAL IMPRESSION(S) / ED DIAGNOSES  Final diagnoses:  SOB (shortness of breath)     ED  Discharge Orders         Ordered    methylPREDNISolone (MEDROL DOSEPAK) 4 MG TBPK tablet     12/25/18 1335    albuterol (VENTOLIN HFA) 108 (90 Base) MCG/ACT inhaler  Every 6 hours PRN     12/25/18 1335           Note:  This document was prepared using Dragon voice recognition software and may include unintentional dictation errors.    Sable Feil, PA-C 12/25/18 1341    Duffy Bruce, MD 12/27/18 [phone]

## 2018-12-25 NOTE — ED Triage Notes (Signed)
Patient c/o sob with exertion that started yesterday. Reports being diagnosed with COVID on 12/16/18 at [facility] in [place]. Was sent here by tele nurse for chest xray. Patient able to speak in complete sentences with no labored breathing in triage.

## 2018-12-25 NOTE — ED Notes (Signed)
Pt ambulated with pulse ox. HR 99 sats 100% while walking.

## 2019-07-22 ENCOUNTER — Telehealth: Payer: Self-pay | Admitting: General Practice

## 2019-07-22 NOTE — Telephone Encounter (Signed)
Individual has been contacted regarding ED referral. No further attempts to contact individual will be made.

## 2020-05-12 ENCOUNTER — Ambulatory Visit: Payer: Medicaid Other | Admitting: Nurse Practitioner

## 2020-05-15 ENCOUNTER — Encounter: Payer: Self-pay | Admitting: Nurse Practitioner

## 2020-05-15 DIAGNOSIS — D649 Anemia, unspecified: Secondary | ICD-10-CM | POA: Insufficient documentation

## 2020-05-15 DIAGNOSIS — R7301 Impaired fasting glucose: Secondary | ICD-10-CM | POA: Insufficient documentation

## 2020-05-19 ENCOUNTER — Telehealth: Payer: Self-pay

## 2020-05-19 NOTE — Telephone Encounter (Signed)
Called pt to confirm tomorrow's appt. Pt is aware that insurance is out of network. Pt was given estimate and min for tomorrow's visit

## 2020-05-20 ENCOUNTER — Encounter: Payer: Self-pay | Admitting: Nurse Practitioner

## 2020-05-20 ENCOUNTER — Other Ambulatory Visit: Payer: Self-pay

## 2020-05-20 ENCOUNTER — Ambulatory Visit (INDEPENDENT_AMBULATORY_CARE_PROVIDER_SITE_OTHER): Payer: Self-pay | Admitting: Nurse Practitioner

## 2020-05-20 VITALS — BP 148/90 | HR 78 | Temp 98.3°F | Ht 67.0 in | Wt 188.4 lb

## 2020-05-20 DIAGNOSIS — D649 Anemia, unspecified: Secondary | ICD-10-CM

## 2020-05-20 DIAGNOSIS — R7301 Impaired fasting glucose: Secondary | ICD-10-CM | POA: Diagnosis not present

## 2020-05-20 DIAGNOSIS — Z86718 Personal history of other venous thrombosis and embolism: Secondary | ICD-10-CM

## 2020-05-20 DIAGNOSIS — M5136 Other intervertebral disc degeneration, lumbar region: Secondary | ICD-10-CM

## 2020-05-20 DIAGNOSIS — R591 Generalized enlarged lymph nodes: Secondary | ICD-10-CM | POA: Diagnosis not present

## 2020-05-20 DIAGNOSIS — Z8616 Personal history of COVID-19: Secondary | ICD-10-CM

## 2020-05-20 DIAGNOSIS — I10 Essential (primary) hypertension: Secondary | ICD-10-CM | POA: Diagnosis not present

## 2020-05-20 DIAGNOSIS — Z7689 Persons encountering health services in other specified circumstances: Secondary | ICD-10-CM

## 2020-05-20 MED ORDER — LISINOPRIL 20 MG PO TABS: 20.0000 mg | ORAL_TABLET | Freq: Every day | ORAL | 4 refills | Status: DC

## 2020-05-20 MED ORDER — CYCLOBENZAPRINE HCL 10 MG PO TABS: 10.0000 mg | ORAL_TABLET | Freq: Three times a day (TID) | ORAL | 0 refills | Status: DC | PRN

## 2020-05-20 NOTE — Assessment & Plan Note (Signed)
Recent occurrence after URI reported, this is improving.  None noted today.  Monitor and if return of edema or symptoms, then return to office.

## 2020-05-20 NOTE — Assessment & Plan Note (Signed)
Noted on 2018 labs, at this time patient self pay and asymptomatic -- recheck this next visit.

## 2020-05-20 NOTE — Assessment & Plan Note (Signed)
Noted on past labs with glucose 114 to 120.  Check A1c today, patient in agreeance with this.

## 2020-05-20 NOTE — Assessment & Plan Note (Signed)
In 2018 -- brachial vein of right upper extremity in ER -- on review of specialists follow-up visits this was not present and medication was stopped.

## 2020-05-20 NOTE — Assessment & Plan Note (Signed)
No current long haul symptoms, monitor closely.

## 2020-05-20 NOTE — Assessment & Plan Note (Signed)
Chronic, ongoing.  Currently using boyfriend's Lisinopril 10 MG and BP remains above goal.  At this time increase Lisinopril to 20 MG daily, script sent.  Educated on side effects and is aware to notify provider if presents.  Recommend she monitor BP at least a few mornings a week at home and document.  DASH diet at home.  Labs today: TSH, BMP, lipid.  Return in 4 weeks.

## 2020-05-20 NOTE — Progress Notes (Signed)
New Patient Office Visit  Subjective:  Patient ID: Christine Chase, female    DOB: 1965/12/24  Age: 55 y.o. MRN: [id number]  CC:  Chief Complaint  Patient presents with  . Hypertension  . Establish Care    Patient states she is here to establish care and states she is here because she needs a provider who can prescribed her blood pressure medication. Patient states she has been out of her BP medication has been out of it for a year and states she has been taking her boyfriend's Lisinopril. Patient states her medication was not helping her at the time as she noticed her BP readings were still elevated. Patient states since she has been taking boyfriend's prescription she noticed a change in her readings.   . Facial Swelling    Patient states she noticed a swelling in her glands under her chin last Sunday, and states she thinks it is fluids and states she had some amoxicillin around the house and noticed some of the swelling went down a little.     HPI Christine Chase presents for new patient visit to establish care.  Introduced to Designer, jewellery role and practice setting.  All questions answered.  Discussed provider/patient relationship and expectations.  She previously received care across street, they closed and was without insurance and medication for almost 2 years with her BP.  No longer having menstrual cycles -- last was 1 1/2 years.  Has history of Covid x 2 = initially 2020 and then again February 2022.  Has history of degenerative disc in lower back -- has occasional flares which uses Flexeril for.  HYPERTENSION Was without medication for 2 years -- was diagnosed about 3 years -- took Verapamil 80 MG daily and Losartan 50 MG daily.  At this time has been taking her boyfriend's Lisinopril for a long while -- he takes 10 MG.  Has had no ADR with this.  Is not a smoker and does not drink alcohol.  Recently reports some congestion about one week ago and had some lymph node swelling  under chin and under right arm.  Took some Amoxicillin that was leftover at home and felt this helped the swelling under her chin. Hypertension status: uncontrolled  Satisfied with current treatment? yes Duration of hypertension: chronic BP monitoring frequency:  a few times a month BP range: 140/80 to 162/90 BP medication side effects:  no Medication compliance: good compliance Aspirin: no -- takes occasional Goodies Recurrent headaches: no Visual changes: no Palpitations: no Dyspnea: no Chest pain: no Lower extremity edema: no Dizzy/lightheaded: no   Past Medical History:  Diagnosis Date  . COVID-19   . DVT of upper extremity (deep vein thrombosis) ([place])   . Fibroids   . Hypertension     Past Surgical History:  Procedure Laterality Date  . MYOMECTOMY      Family History  Problem Relation Age of Onset  . Hypertension Mother   . COPD Father   . Heart disease Father   . Diabetes Father   . Hypertension Sister   . Diabetes Sister   . Hypertension Maternal Grandmother   . Hypertension Paternal Grandmother   . Hypertension Paternal Grandfather   . Hypertension Sister   . Hypertension Sister   . Hypertension Sister     Social History   Socioeconomic History  . Marital status: Widowed    Spouse name: Not on file  . Number of children: 0  . Years of education: Not on file  .  Highest education level: Not on file  Occupational History  . Not on file  Tobacco Use  . Smoking status: Never Smoker  . Smokeless tobacco: Never Used  Vaping Use  . Vaping Use: Never used  Substance and Sexual Activity  . Alcohol use: No  . Drug use: No  . Sexual activity: Yes  Other Topics Concern  . Not on file  Social History Narrative  . Not on file   Social Determinants of Health   Financial Resource Strain: Low Risk   . Difficulty of Paying Living Expenses: Not hard at all  Food Insecurity: No Food Insecurity  . Worried About Charity fundraiser in the Last Year: Never  true  . Ran Out of Food in the Last Year: Never true  Transportation Needs: No Transportation Needs  . Lack of Transportation (Medical): No  . Lack of Transportation (Non-Medical): No  Physical Activity: Sufficiently Active  . Days of Exercise per Week: 3 days  . Minutes of Exercise per Session: 60 min  Stress: No Stress Concern Present  . Feeling of Stress : Only a little  Social Connections: Moderately Integrated  . Frequency of Communication with Friends and Family: Twice a week  . Frequency of Social Gatherings with Friends and Family: Twice a week  . Attends Religious Services: More than 4 times per year  . Active Member of Clubs or Organizations: No  . Attends Archivist Meetings: Never  . Marital Status: Living with partner  Intimate Partner Violence: Not on file    ROS Review of Systems  Constitutional: Negative for activity change, appetite change, diaphoresis, fatigue and fever.  Respiratory: Negative for cough, chest tightness and shortness of breath.   Cardiovascular: Negative for chest pain, palpitations and leg swelling.  Gastrointestinal: Negative.   Endocrine: Negative for cold intolerance, heat intolerance, polydipsia, polyphagia and polyuria.  Neurological: Negative.   Psychiatric/Behavioral: Negative.     Objective:   Today's Vitals: BP (!) 148/90   Pulse 78   Temp 98.3 F (36.8 C) (Oral)   Ht 5\' 7"  (1.702 m)   Wt 188 lb 6.4 oz (85.5 kg)   SpO2 96%   BMI 29.51 kg/m   Physical Exam Vitals and nursing note reviewed.  Constitutional:      General: She is awake. She is not in acute distress.    Appearance: She is well-developed, well-groomed and overweight. She is not ill-appearing or toxic-appearing.  HENT:     Head: Normocephalic.     Right Ear: Hearing normal.     Left Ear: Hearing normal.  Eyes:     General: Lids are normal.        Right eye: No discharge.        Left eye: No discharge.     Conjunctiva/sclera: Conjunctivae normal.      Pupils: Pupils are equal, round, and reactive to light.  Neck:     Thyroid: No thyromegaly.     Vascular: No carotid bruit.  Cardiovascular:     Rate and Rhythm: Normal rate and regular rhythm.     Heart sounds: Normal heart sounds. No murmur heard. No gallop.   Pulmonary:     Effort: Pulmonary effort is normal. No accessory muscle usage or respiratory distress.     Breath sounds: Normal breath sounds.  Abdominal:     General: Bowel sounds are normal.     Palpations: Abdomen is soft. There is no hepatomegaly.  Musculoskeletal:     Cervical back:  Normal range of motion and neck supple.     Right lower leg: No edema.     Left lower leg: No edema.  Lymphadenopathy:     Cervical: No cervical adenopathy.  Skin:    General: Skin is warm and dry.     Capillary Refill: Capillary refill takes less than 2 seconds.  Neurological:     Mental Status: She is alert and oriented to person, place, and time.     Deep Tendon Reflexes: Reflexes are normal and symmetric.     Reflex Scores:      Brachioradialis reflexes are 2+ on the right side and 2+ on the left side.      Patellar reflexes are 2+ on the right side and 2+ on the left side. Psychiatric:        Attention and Perception: Attention normal.        Mood and Affect: Mood normal.        Speech: Speech normal.        Behavior: Behavior normal. Behavior is cooperative.        Thought Content: Thought content normal.    Assessment & Plan:   Problem List Items Addressed This Visit      Cardiovascular and Mediastinum   Essential hypertension    Chronic, ongoing.  Currently using boyfriend's Lisinopril 10 MG and BP remains above goal.  At this time increase Lisinopril to 20 MG daily, script sent.  Educated on side effects and is aware to notify provider if presents.  Recommend she monitor BP at least a few mornings a week at home and document.  DASH diet at home.  Labs today: TSH, BMP, lipid.  Return in 4 weeks.       Relevant  Medications   lisinopril (ZESTRIL) 20 MG tablet   Other Relevant Orders   Basic metabolic panel   TSH   Lipid Panel w/o Chol/HDL Ratio     Endocrine   IFG (impaired fasting glucose)    Noted on past labs with glucose 114 to 120.  Check A1c today, patient in agreeance with this.      Relevant Orders   HgB A1c     Musculoskeletal and Integument   Lumbar degenerative disc disease    Reports history of this and has occasional acute sciatic pain which muscle relaxer helps.  Will send in Flexeril 10 MG TID PRN to use as needed.  If acute flare do recommend she come to office if no benefit from Flexeril.  Consider repeat imaging in future.      Relevant Medications   cyclobenzaprine (FLEXERIL) 10 MG tablet     Immune and Lymphatic   Lymphadenopathy    Recent occurrence after URI reported, this is improving.  None noted today.  Monitor and if return of edema or symptoms, then return to office.        Other   History of DVT (deep vein thrombosis)    In 2018 -- brachial vein of right upper extremity in ER -- on review of specialists follow-up visits this was not present and medication was stopped.      Low hemoglobin    Noted on 2018 labs, at this time patient self pay and asymptomatic -- recheck this next visit.      History of 2019 novel coronavirus disease (COVID-19)    No current long haul symptoms, monitor closely.       Other Visit Diagnoses    Encounter to establish care    -  Primary      Outpatient Encounter Medications as of 05/20/2020  Medication Sig  . cyclobenzaprine (FLEXERIL) 10 MG tablet Take 1 tablet (10 mg total) by mouth 3 (three) times daily as needed for muscle spasms.  Marland Kitchen lisinopril (ZESTRIL) 20 MG tablet Take 1 tablet (20 mg total) by mouth daily.  . [DISCONTINUED] albuterol (PROVENTIL HFA;VENTOLIN HFA) 108 (90 Base) MCG/ACT inhaler Inhale 2 puffs into the lungs every 6 (six) hours as needed for wheezing or shortness of breath. (Patient not taking:  Reported on 05/20/2020)  . [DISCONTINUED] albuterol (VENTOLIN HFA) 108 (90 Base) MCG/ACT inhaler Inhale 2 puffs into the lungs every 6 (six) hours as needed for wheezing or shortness of breath. (Patient not taking: Reported on 05/20/2020)  . [DISCONTINUED] amoxicillin (AMOXIL) 500 MG capsule Take 1 capsule (500 mg total) by mouth 3 (three) times daily. (Patient not taking: Reported on 05/20/2020)  . [DISCONTINUED] lidocaine (XYLOCAINE) 2 % solution Use as directed 5 mLs in the mouth or throat every 6 (six) hours as needed for mouth pain. Mix with 5 mL of Phenergan DM for swish and swallow. (Patient not taking: Reported on 05/20/2020)  . [DISCONTINUED] losartan (COZAAR) 50 MG tablet Take 1 tablet (50 mg total) by mouth daily. (Patient not taking: Reported on 05/20/2020)  . [DISCONTINUED] methylPREDNISolone (MEDROL DOSEPAK) 4 MG TBPK tablet Take Tapered dose as directed (Patient not taking: Reported on 05/20/2020)  . [DISCONTINUED] promethazine-dextromethorphan (PROMETHAZINE-DM) 6.25-15 MG/5ML syrup Take 5 mLs by mouth 4 (four) times daily as needed for cough. Mix with 5 mL of viscous lidocaine for swish and swallow. (Patient not taking: Reported on 05/20/2020)  . [DISCONTINUED] verapamil (CALAN) 80 MG tablet Take 1 tablet (80 mg total) by mouth daily.   No facility-administered encounter medications on file as of 05/20/2020.    Follow-up: Return in about 4 weeks (around 06/17/2020) for HTN.   Venita Lick, NP

## 2020-05-20 NOTE — Patient Instructions (Signed)
[facility]  Address: [address], [place], [place] [address]  Phone: [phone]   Mammogram A mammogram is an X-ray of the breasts. This is done to check for changes that are not normal. This test can look for changes that may be caused by breast cancer or other problems. Mammograms are regularly done on women beginning at age 54. A man may have a mammogram if he has a lump or swelling in his breast. Tell a doctor:  About any allergies you have.  If you have breast implants.  If you have had breast disease, biopsy, or surgery.  If you have a family history of breast cancer.  If you are breastfeeding.  Whether you are pregnant or may be pregnant. What are the risks? Generally, this is a safe procedure. But problems may occur, including:  Being exposed to radiation. Radiation levels are very low with this test.  The need for more tests.  The results were not read properly.  Trouble finding breast cancer in women with dense breasts. What happens before the test?  Have this test done about 1-2 weeks after your menstrual period. This is often when your breasts are the least tender.  If you are visiting a new doctor or clinic, have any past mammogram images sent to your new doctor's office.  Wash your breasts and under your arms on the day of the test.  Do not use deodorants, perfumes, lotions, or powders on the day of the test.  Take off any jewelry from your neck.  Wear clothes that you can change into and out of easily. What happens during the test?  You will take off your clothes from the waist up. You will put on a gown.  You will stand in front of the X-ray machine.  Each breast will be placed between two plastic or glass plates. The plates will press down on your breast for a few seconds. Try to relax. This does not cause any harm to your breasts. It may not feel comfortable, but it will be very brief.  X-rays will be  taken from different angles of each breast. The procedure may vary among doctors and hospitals.   What can I expect after the test?  The mammogram will be read by a specialist (radiologist).  You may need to do parts of the test again. This depends on the quality of the images.  You may go back to your normal activities.  It is up to you to get the results of your test. Ask how to get your results when they are ready. Summary  A mammogram is an X-ray of the breasts. It looks for changes that may be caused by breast cancer or other problems.  A man may have this test if he has a lump or swelling in his breast.  Before the test, tell your doctor about any breast problems that you have had in the past.  Have this test done about 1-2 weeks after your menstrual period.  Ask when your test results will be ready. Make sure you get your test results. This information is not intended to replace advice given to you by your health care provider. Make sure you discuss any questions you have with your health care provider. Document Revised: 10/27/2019 Document Reviewed: 10/27/2019 Elsevier Patient Education  2021 Elsevier Inc.  

## 2020-05-20 NOTE — Assessment & Plan Note (Signed)
Reports history of this and has occasional acute sciatic pain which muscle relaxer helps.  Will send in Flexeril 10 MG TID PRN to use as needed.  If acute flare do recommend she come to office if no benefit from Flexeril.  Consider repeat imaging in future.

## 2020-05-21 LAB — LIPID PANEL W/O CHOL/HDL RATIO
Cholesterol, Total: 259 mg/dL — ABNORMAL HIGH (ref 100–199)
HDL: 48 mg/dL (ref >39)
LDL Chol Calc (NIH): 176 mg/dL — ABNORMAL HIGH (ref 0–99)
Triglycerides: 188 mg/dL — ABNORMAL HIGH (ref 0–149)
VLDL Cholesterol Cal: 35 mg/dL (ref 5–40)

## 2020-05-21 LAB — BASIC METABOLIC PANEL
BUN/Creatinine Ratio: 23 (ref 9–23)
BUN: 17 mg/dL (ref 6–24)
CO2: 20 mmol/L (ref 20–29)
Calcium: 9.7 mg/dL (ref 8.7–10.2)
Chloride: 101 mmol/L (ref 96–106)
Creatinine, Ser: 0.75 mg/dL (ref 0.57–1.00)
Glucose: 91 mg/dL (ref 65–99)
Potassium: 4.4 mmol/L (ref 3.5–5.2)
Sodium: 138 mmol/L (ref 134–144)
eGFR: 95 mL/min/{1.73_m2} (ref >59)

## 2020-05-21 LAB — HEMOGLOBIN A1C
Est. average glucose Bld gHb Est-mCnc: 114 mg/dL
Hgb A1c MFr Bld: 5.6 % (ref 4.8–5.6)

## 2020-05-21 LAB — TSH: TSH: 1.46 u[IU]/mL (ref 0.450–4.500)

## 2020-05-21 NOTE — Progress Notes (Signed)
Contacted via MyChart The 10-year ASCVD risk score Mikey Bussing DC Jr., et al., 2013) is: 4.6%   Values used to calculate the score:     Age: 55 years     Sex: Female     Is Non-Hispanic African American: No     Diabetic: No     Tobacco smoker: No     Systolic Blood Pressure: 176 mmHg     Is BP treated: Yes     HDL Cholesterol: 48 mg/dL     Total Cholesterol: 259 mg/dL   Good evening Emree, your labs have returned.  Overall they look good, with exception of cholesterol levels.  Your cholesterol high, but recommendations to make lifestyle changes. Your LDL is above normal. The LDL is the bad cholesterol. Over time and in combination with inflammation and other factors, this contributes to plaque which in turn may lead to stroke and/or heart attack down the road. Sometimes high LDL is primarily genetic, and people might be eating all the right foods but still have high numbers. Other times, there is room for improvement in one's diet and eating healthier can bring this number down and potentially reduce one's risk of heart attack and/or stroke.   To reduce your LDL, Remember - more fruits and vegetables, more fish, and limit red meat and dairy products. More soy, nuts, beans, barley, lentils, oats and plant sterol ester enriched margarine instead of butter. I also encourage eliminating sugar and processed food. Remember, shop on the outside of the grocery store and visit your [employer]. If you would like to talk with me about dietary changes plus or minus medications for your cholesterol, please let me know. We should recheck your cholesterol in 3 months.  Any questions? Keep being awesome!!  Thank you for allowing me to participate in your care.  I appreciate you. Kindest regards, Adeli Frost

## 2020-06-03 ENCOUNTER — Other Ambulatory Visit: Payer: Self-pay

## 2020-06-03 DIAGNOSIS — Z1231 Encounter for screening mammogram for malignant neoplasm of breast: Secondary | ICD-10-CM

## 2020-06-11 ENCOUNTER — Encounter: Payer: Self-pay | Admitting: Nurse Practitioner

## 2020-06-11 DIAGNOSIS — E7800 Pure hypercholesterolemia, unspecified: Secondary | ICD-10-CM | POA: Insufficient documentation

## 2020-06-16 ENCOUNTER — Ambulatory Visit: Payer: 59 | Admitting: Nurse Practitioner

## 2020-09-06 ENCOUNTER — Ambulatory Visit: Payer: Self-pay | Admitting: Nurse Practitioner

## 2020-09-29 ENCOUNTER — Ambulatory Visit: Payer: Self-pay | Admitting: Nurse Practitioner

## 2020-10-03 ENCOUNTER — Other Ambulatory Visit: Payer: Self-pay

## 2020-10-04 MED ORDER — CYCLOBENZAPRINE HCL 10 MG PO TABS: 10.0000 mg | ORAL_TABLET | Freq: Three times a day (TID) | ORAL | 0 refills | Status: DC | PRN

## 2020-11-03 ENCOUNTER — Ambulatory Visit: Payer: Self-pay | Admitting: *Deleted

## 2020-11-03 NOTE — Telephone Encounter (Signed)
Second attempt to reach pt. Left VM to call back.

## 2020-11-03 NOTE — Telephone Encounter (Signed)
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 No. answer. Left message on voicemail  to call clinic back at [phone]

## 2020-11-04 ENCOUNTER — Telehealth: Payer: 59 | Admitting: Nurse Practitioner

## 2020-11-04 NOTE — Telephone Encounter (Signed)
Patient has Mychart appointment today 11/04/2020 with provider.

## 2020-11-29 ENCOUNTER — Ambulatory Visit: Payer: 59 | Admitting: Nurse Practitioner

## 2021-01-26 ENCOUNTER — Emergency Department
Admission: EM | Admit: 2021-01-26 | Discharge: 2021-01-26 | Disposition: A | Discharge status: Home / Self Care | Payer: 59 | Attending: Emergency Medicine | Admitting: Emergency Medicine

## 2021-01-26 ENCOUNTER — Encounter: Payer: Self-pay | Admitting: Emergency Medicine

## 2021-01-26 ENCOUNTER — Other Ambulatory Visit: Payer: Self-pay

## 2021-01-26 DIAGNOSIS — I10 Essential (primary) hypertension: Secondary | ICD-10-CM | POA: Diagnosis not present

## 2021-01-26 DIAGNOSIS — R300 Dysuria: Secondary | ICD-10-CM

## 2021-01-26 DIAGNOSIS — N3090 Cystitis, unspecified without hematuria: Secondary | ICD-10-CM | POA: Diagnosis not present

## 2021-01-26 DIAGNOSIS — Z79899 Other long term (current) drug therapy: Secondary | ICD-10-CM | POA: Diagnosis not present

## 2021-01-26 DIAGNOSIS — N3091 Cystitis, unspecified with hematuria: Secondary | ICD-10-CM | POA: Insufficient documentation

## 2021-01-26 DIAGNOSIS — Z8616 Personal history of COVID-19: Secondary | ICD-10-CM | POA: Insufficient documentation

## 2021-01-26 DIAGNOSIS — R319 Hematuria, unspecified: Secondary | ICD-10-CM | POA: Diagnosis not present

## 2021-01-26 LAB — URINALYSIS, ROUTINE W REFLEX MICROSCOPIC
Bilirubin Urine: NEGATIVE
Glucose, UA: NEGATIVE mg/dL
Ketones, ur: NEGATIVE mg/dL
Nitrite: NEGATIVE
Protein, ur: NEGATIVE mg/dL
Specific Gravity, Urine: 1.01 (ref 1.005–1.030)
pH: 5.5 (ref 5.0–8.0)

## 2021-01-26 LAB — URINALYSIS, MICROSCOPIC (REFLEX)
RBC / HPF: >50 RBC/hpf (ref 0–5)
WBC, UA: >50 WBC/hpf (ref 0–5)

## 2021-01-26 MED ORDER — PHENAZOPYRIDINE HCL 200 MG PO TABS
200.0000 mg | ORAL_TABLET | Freq: Once | ORAL | Status: AC
  Administered 2021-01-26: 200 mg via ORAL
  Filled 2021-01-26: qty 1

## 2021-01-26 MED ORDER — CEPHALEXIN 500 MG PO CAPS
500.0000 mg | ORAL_CAPSULE | Freq: Once | ORAL | Status: AC
  Administered 2021-01-26: 500 mg via ORAL
  Filled 2021-01-26: qty 1

## 2021-01-26 MED ORDER — KETOROLAC TROMETHAMINE 60 MG/2ML IM SOLN
30.0000 mg | Freq: Once | INTRAMUSCULAR | Status: DC
  Filled 2021-01-26: qty 2

## 2021-01-26 MED ORDER — HYDROCODONE-ACETAMINOPHEN 5-325 MG PO TABS: 1.0000 | ORAL_TABLET | Freq: Four times a day (QID) | ORAL | 0 refills | Status: DC | PRN

## 2021-01-26 MED ORDER — PHENAZOPYRIDINE HCL 200 MG PO TABS: 200.0000 mg | ORAL_TABLET | Freq: Three times a day (TID) | ORAL | 0 refills | Status: DC | PRN

## 2021-01-26 MED ORDER — IBUPROFEN 800 MG PO TABS: 800.0000 mg | ORAL_TABLET | Freq: Three times a day (TID) | ORAL | 0 refills | Status: DC | PRN

## 2021-01-26 MED ORDER — HYDROCODONE-ACETAMINOPHEN 5-325 MG PO TABS
1.0000 | ORAL_TABLET | Freq: Once | ORAL | Status: AC
  Administered 2021-01-26: 1 via ORAL
  Filled 2021-01-26: qty 1

## 2021-01-26 MED ORDER — CEPHALEXIN 500 MG PO CAPS: 500.0000 mg | ORAL_CAPSULE | Freq: Three times a day (TID) | ORAL | 0 refills | Status: DC

## 2021-01-26 NOTE — ED Provider Notes (Signed)
[facility] Provider Note    Event Date/Time   First MD Initiated Contact with Patient 01/26/21 [phone]     (approximate)   History   Urinary Frequency and Hematuria   HPI  Christine Chase is a 56 y.o. female who presents to the ED from home with a chief complaint of urinary frequency, dysuria and hematuria.  Symptoms started approximately 1 AM, awaken patient from sleep.  Also endorses lower back pain across her back and bladder pressure.  Denies fever, chills, abdominal pain, nausea, vomiting or diarrhea.     Past Medical History   Past Medical History:  Diagnosis Date   COVID-19    DVT of upper extremity (deep vein thrombosis) (HCC)    Fibroids    Hypertension      Active Problem List   Patient Active Problem List   Diagnosis Date Noted   Elevated low density lipoprotein (LDL) cholesterol level 06/11/2020   Essential hypertension 05/20/2020   Lumbar degenerative disc disease 05/20/2020   History of 2019 novel coronavirus disease (COVID-19) 05/20/2020   IFG (impaired fasting glucose) 05/15/2020   Low hemoglobin 05/15/2020   History of DVT (deep vein thrombosis) 05/23/2016     Past Surgical History   Past Surgical History:  Procedure Laterality Date   MYOMECTOMY       Home Medications   Prior to Admission medications   Medication Sig Start Date End Date Taking? Authorizing Provider  cephALEXin (KEFLEX) 500 MG capsule Take 1 capsule (500 mg total) by mouth 3 (three) times daily. 01/26/21  Yes Paulette Blanch, MD  HYDROcodone-acetaminophen (NORCO) 5-325 MG tablet Take 1 tablet by mouth every 6 (six) hours as needed for moderate pain. 01/26/21  Yes Paulette Blanch, MD  ibuprofen (ADVIL) 800 MG tablet Take 1 tablet (800 mg total) by mouth every 8 (eight) hours as needed for moderate pain. 01/26/21  Yes Paulette Blanch, MD  phenazopyridine (PYRIDIUM) 200 MG tablet Take 1 tablet (200 mg total) by mouth 3 (three) times daily as needed for  pain. 01/26/21  Yes Paulette Blanch, MD  cyclobenzaprine (FLEXERIL) 10 MG tablet Take 1 tablet (10 mg total) by mouth 3 (three) times daily as needed for muscle spasms. 10/04/20   Cannady, Henrine Screws T, NP  lisinopril (ZESTRIL) 20 MG tablet Take 1 tablet (20 mg total) by mouth daily. 05/20/20   Venita Lick, NP     Allergies  Patient has no known allergies.   Family History   Family History  Problem Relation Age of Onset   Hypertension Mother    COPD Father    Heart disease Father    Diabetes Father    Hypertension Sister    Diabetes Sister    Hypertension Maternal Grandmother    Hypertension Paternal Grandmother    Hypertension Paternal Grandfather    Hypertension Sister    Hypertension Sister    Hypertension Sister      Physical Exam  Triage Vital Signs: ED Triage Vitals  Enc Vitals Group     BP 01/26/21 0429 (!) 181/99     Pulse Rate 01/26/21 0429 71     Resp 01/26/21 0429 16     Temp 01/26/21 0429 98.3 F (36.8 C)     Temp Source 01/26/21 0429 Oral     SpO2 01/26/21 0429 98 %     Weight 01/26/21 0430 175 lb (79.4 kg)     Height 01/26/21 0430 5\' 5"  (1.651 m)  Head Circumference --      Peak Flow --      Pain Score 01/26/21 0429 9     Pain Loc --      Pain Edu? --      Excl. in [place]? --     Updated Vital Signs: BP (!) 181/99 (BP Location: Left Arm)    Pulse 71    Temp 98.3 F (36.8 C) (Oral)    Resp 16    Ht 5\' 5"  (1.651 m)    Wt 79.4 kg    LMP 08/24/2018    SpO2 98%    BMI 29.12 kg/m    General: Awake, mild distress.  CV:  Good peripheral perfusion.  Resp:  Normal effort.  Abd:  Nontender to light or deep palpation.  No CVA tenderness.  No distention.  Other:  No vesicles on skin.   ED Results / Procedures / Treatments  Labs (all labs ordered are listed, but only abnormal results are displayed) Labs Reviewed  URINALYSIS, ROUTINE W REFLEX MICROSCOPIC - Abnormal; Notable for the following components:      Result Value   APPearance HAZY (*)     Hgb urine dipstick LARGE (*)    Leukocytes,Ua SMALL (*)    All other components within normal limits  URINALYSIS, MICROSCOPIC (REFLEX) - Abnormal; Notable for the following components:   Bacteria, UA RARE (*)    All other components within normal limits  URINE CULTURE     EKG  None   RADIOLOGY None   Official radiology report(s): No results found.   PROCEDURES:  Critical Care performed: No  Procedures   MEDICATIONS ORDERED IN ED: Medications  ketorolac (TORADOL) injection 30 mg (has no administration in time range)  HYDROcodone-acetaminophen (NORCO/VICODIN) 5-325 MG per tablet 1 tablet (has no administration in time range)  phenazopyridine (PYRIDIUM) tablet 200 mg (has no administration in time range)  cephALEXin (KEFLEX) capsule 500 mg (has no administration in time range)     IMPRESSION / MDM / ASSESSMENT AND PLAN / ED COURSE  I reviewed the triage vital signs and the nursing notes.                             56 year old female presenting with dysuria, urinary frequency and hematuria. Differential diagnosis includes, but is not limited to, ovarian cyst, ovarian torsion, acute appendicitis, diverticulitis, urinary tract infection/pyelonephritis, endometriosis, bowel obstruction, colitis, renal colic, gastroenteritis, hernia, fibroids, endometriosis, pregnancy related pain including ectopic pregnancy, etc.   Patient is afebrile, not tachycardic nor tachypneic.  Low suspicion for sepsis.  No flank pain; low suspicion for kidney stone.  Awaiting results of urinalysis.  Clinical Course as of 01/26/21 0514  Wed Jan 26, 2021  0442 Updated patient and spouse on UA results indicating leukocyte positive UTI with RBC>50 and WBC>50.  Administer IM Toradol, Norco for pain, Pyridium for dysuria, start Keflex for antibiotic.  Patient will follow up with her PCP next week.  Strict return precautions given.  Patient verbalizes understanding agrees with plan of care [JS]     Clinical Course User Index [JS] Paulette Blanch, MD     FINAL CLINICAL IMPRESSION(S) / ED DIAGNOSES   Final diagnoses:  Dysuria  Hemorrhagic cystitis     Rx / DC Orders   ED Discharge Orders          Ordered    cephALEXin (KEFLEX) 500 MG capsule  3 times daily  01/26/21 0506    phenazopyridine (PYRIDIUM) 200 MG tablet  3 times daily PRN        01/26/21 0506    ibuprofen (ADVIL) 800 MG tablet  Every 8 hours PRN        01/26/21 0506    HYDROcodone-acetaminophen (NORCO) 5-325 MG tablet  Every 6 hours PRN        01/26/21 0506             Note:  This document was prepared using Dragon voice recognition software and may include unintentional dictation errors.   Paulette Blanch, MD 01/26/21 [phone]

## 2021-01-26 NOTE — Discharge Instructions (Signed)
Take antibiotic as prescribed (Keflex 500mg  three times daily x 7 days). You may take Pyridium as needed for urinary discomfort. You may take pain medicines as needed (Motrin/Norco #15). Return to the ER for worsening symptoms, persistent vomiting, fever or other concerns

## 2021-01-26 NOTE — ED Notes (Signed)
Pt discharge information reviewed. Pt understands need for follow up care and when to return if symptoms worsen. All questions answered. Pt is alert and oriented with even and regular respirations. Pt is seen ambulating out of department with string steady gait.   

## 2021-01-26 NOTE — ED Triage Notes (Signed)
Pt to ED from home c/o hematuria, urinary frequency and burning with urination that started around 0100 this morning.  States lower back pain across her back and bladder pressure.  Denies n/v/d, denies flank pain, pt A&Ox4, chest rise even and unlabored in NAD at this time.

## 2021-01-28 LAB — URINE CULTURE: Culture: >=100000 — AB

## 2021-02-07 ENCOUNTER — Ambulatory Visit: Payer: Medicaid Other | Admitting: Nurse Practitioner

## 2021-02-22 ENCOUNTER — Ambulatory Visit: Payer: Self-pay | Admitting: *Deleted

## 2021-02-22 ENCOUNTER — Other Ambulatory Visit: Payer: Self-pay | Admitting: Nurse Practitioner

## 2021-02-22 NOTE — Telephone Encounter (Signed)
I returned pt's call.   Seen in ED 01/26/2021 for UTI.   Requesting a refill of those medications because the infection is not gone.  Phenazopyridine, Cyclobenzaprine 10 mg and cephalexin 500 mg was given to her in the ED.  I left a voicemail to call back.  Declined an appt.   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 Plus was seen in the ED per her pt note.

## 2021-02-22 NOTE — Telephone Encounter (Signed)
°  Chief Complaint: UTI- medication request Symptoms: burning with urination, pressure, frequency Frequency: 1/18 Pertinent Negatives: Patient denies fever Disposition: [] ED /[] Urgent Care (no appt availability in office) / [] Appointment(In office/virtual)/ []  [facility]/ [] Home Care/ [x] Refused Recommended Disposition /[] [facility] [facility]/ []  Follow-up with PCP Additional Notes: Patient states she was seen at ED 1/18 and treated for UTI.(Culture sent) patient states her symptoms improved - no more blood in urine- but she still has burning and pressure. Patient is asking for retreat. Advised needs appointment- she states she can not come until next week. Advised will send note- may can do virtual- patient asks office to call her back   Reason for Disposition  Urinating more frequently than usual (i.e., frequency)  Answer Assessment - Initial Assessment Questions 1. SYMPTOM: "What's the main symptom you're concerned about?" (e.g., frequency, incontinence)     Burning, pressure 2. ONSET: "When did the symptoms  start?"     1/18 3. PAIN: "Is there any pain?" If Yes, ask: "How bad is it?" (Scale: 1-10; mild, moderate, severe)     burning 4. CAUSE: "What do you think is causing the symptoms?"     UTI 5. OTHER SYMPTOMS: "Do you have any other symptoms?" (e.g., fever, flank pain, blood in urine, pain with urination)     Pain with urination, pressure 6. PREGNANCY: "Is there any chance you are pregnant?" "When was your last menstrual period?"     *No Answer*  Protocols used: Urinary Symptoms-A-AH

## 2021-02-23 NOTE — Telephone Encounter (Signed)
Appointment scheduled for 03/01/2021.

## 2021-02-23 NOTE — Telephone Encounter (Signed)
Requested medications are due for refill today.  yes  Requested medications are on the active medications list.  yes  Last refill. 10/04/2020 #30 0 refills  Future visit scheduled.   yes  Notes to clinic.  Medication not delegated.    Requested Prescriptions  Pending Prescriptions Disp Refills   cyclobenzaprine (FLEXERIL) 10 MG tablet [Pharmacy Med Name: CYCLOBENZAPRINE 10 MG TABLET] 30 tablet 0    Sig: TAKE 1 TABLET BY MOUTH THREE TIMES A DAY AS NEEDED FOR MUSCLE SPASMS     Not Delegated - Analgesics:  Muscle Relaxants Failed - 02/22/2021  2:06 PM      Failed - This refill cannot be delegated      Failed - Valid encounter within last 6 months    Recent Outpatient Visits           9 months ago Encounter to establish care   [facility] Venita Lick, NP       Future Appointments             In 6 days Venita Lick, NP [facility]

## 2021-02-23 NOTE — Telephone Encounter (Signed)
Please call and schedule an appointment per Jolene.

## 2021-02-23 NOTE — Telephone Encounter (Signed)
Please advise 

## 2021-02-24 NOTE — Telephone Encounter (Signed)
Lmom asking pt to call back to see if she wants to be seen sooner.

## 2021-02-27 NOTE — Patient Instructions (Signed)

## 2021-03-01 ENCOUNTER — Encounter: Payer: Medicaid Other | Admitting: Nurse Practitioner

## 2021-03-01 NOTE — Progress Notes (Signed)
Rescheduled, not seen.

## 2021-03-02 ENCOUNTER — Encounter: Payer: Self-pay | Admitting: Nurse Practitioner

## 2021-03-02 ENCOUNTER — Telehealth (INDEPENDENT_AMBULATORY_CARE_PROVIDER_SITE_OTHER): Payer: 59 | Admitting: Nurse Practitioner

## 2021-03-02 DIAGNOSIS — I10 Essential (primary) hypertension: Secondary | ICD-10-CM | POA: Diagnosis not present

## 2021-03-02 DIAGNOSIS — M5136 Other intervertebral disc degeneration, lumbar region: Secondary | ICD-10-CM

## 2021-03-02 MED ORDER — LIDOCAINE 5 % EX PTCH: 1.0000 | MEDICATED_PATCH | CUTANEOUS | 0 refills | Status: DC

## 2021-03-02 MED ORDER — CYCLOBENZAPRINE HCL 10 MG PO TABS: ORAL_TABLET | ORAL | 4 refills | Status: DC

## 2021-03-02 NOTE — Patient Instructions (Signed)

## 2021-03-02 NOTE — Progress Notes (Signed)
LMP 08/24/2018    Subjective:    Patient ID: Christine Chase, female    DOB: 26-Nov-1965, 56 y.o.   MRN: [id number]  HPI: Christine Chase is a 56 y.o. female  Chief Complaint  Patient presents with   Medication Refill    Patient is here for a medication refill follow up. Patient denies having any concerns at today's visit. Patient is also requesting a refill on her Pyridium to help with lingering symptoms of UTI.    Back Pain    Patient states she is experiencing back pain and says she is not sure if it is her arthritis. Patient states in the past they have prescribed Flexeril to help with the pain and discomfort.    This visit was completed via video visit through MyChart due to the restrictions of the COVID-19 pandemic. All issues as above were discussed and addressed. Physical exam was done as above through visual confirmation on video through MyChart. If it was felt that the patient should be evaluated in the office, they were directed there. The patient verbally consented to this visit. Location of the patient: home Location of the provider: work Those involved with this call:  Provider: Marnee Guarneri, DNP CMA: Irena Reichmann, [place] Desk/Registration: [employer]  Time spent on call:  21 minutes with patient face to face via video conference. More than 50% of this time was spent in counseling and coordination of care. 15 minutes total spent in review of patient's record and preparation of their chart.  I verified patient identity using two factors (patient name and date of birth). Patient consents verbally to being seen via telemedicine visit today.    HYPERTENSION Is still taking Lisinopril 20 MG daily.  Has had no ADR with this. Hypertension status: stable  Satisfied with current treatment? yes Duration of hypertension: chronic BP monitoring frequency:  a few times a month BP range: 130/80 range BP medication side effects:  no Medication compliance: good  compliance Previous BP meds: as above Aspirin: no Recurrent headaches: no Visual changes: no Palpitations: no Dyspnea: no Chest pain: no Lower extremity edema: no Dizzy/lightheaded: no  The 10-year ASCVD risk score (Arnett DK, et al., 2019) is: 7.3%   Values used to calculate the score:     Age: 67 years     Sex: Female     Is Non-Hispanic African American: No     Diabetic: No     Tobacco smoker: No     Systolic Blood Pressure: 211 mmHg     Is BP treated: Yes     HDL Cholesterol: 48 mg/dL     Total Cholesterol: 259 mg/dL  BACK PAIN Has underlying chronic back pain (slipped disc)  and once received steroid injections.  Has taken muscle relaxer in past.  Has been working out in gym and lost 13 pounds.   Duration: weeks acute with chronic Mechanism of injury: unknown Location: L>R, bilateral, and low back Onset: gradual Severity: 9/10 Quality: dull, aching, and throbbing Frequency: intermittent Radiation: L leg above the knee Aggravating factors: lifting, movement, and bending Alleviating factors:  Goodies Status: stable Treatments attempted:  Goodies and rest  Relief with NSAIDs?: No NSAIDs Taken Nighttime pain:   with laying down Paresthesias / decreased sensation:  no Bowel / bladder incontinence:  no Fevers:  no Dysuria / urinary frequency:  no   Relevant past medical, surgical, family and social history reviewed and updated as indicated. Interim medical history since our last visit reviewed. Allergies  and medications reviewed and updated.  Review of Systems  Constitutional:  Negative for activity change, appetite change, diaphoresis, fatigue and fever.  Respiratory:  Negative for cough, chest tightness and shortness of breath.   Cardiovascular:  Negative for chest pain, palpitations and leg swelling.  Gastrointestinal: Negative.   Endocrine: Negative for cold intolerance, heat intolerance, polydipsia, polyphagia and polyuria.  Musculoskeletal:  Positive for back  pain.  Neurological: Negative.   Psychiatric/Behavioral: Negative.     Per HPI unless specifically indicated above     Objective:    LMP 08/24/2018   Wt Readings from Last 3 Encounters:  01/26/21 175 lb (79.4 kg)  05/20/20 188 lb 6.4 oz (85.5 kg)  12/25/18 171 lb (77.6 kg)    Physical Exam Vitals and nursing note reviewed.  Constitutional:      General: She is awake. She is not in acute distress.    Appearance: She is well-developed. She is not ill-appearing.  HENT:     Head: Normocephalic.     Right Ear: Hearing normal.     Left Ear: Hearing normal.  Eyes:     General: Lids are normal.        Right eye: No discharge.        Left eye: No discharge.     Conjunctiva/sclera: Conjunctivae normal.  Pulmonary:     Effort: Pulmonary effort is normal. No accessory muscle usage or respiratory distress.  Musculoskeletal:     Cervical back: Normal range of motion.  Neurological:     Mental Status: She is alert and oriented to person, place, and time.  Psychiatric:        Attention and Perception: Attention normal.        Mood and Affect: Mood normal.        Behavior: Behavior normal. Behavior is cooperative.        Thought Content: Thought content normal.        Judgment: Judgment normal.   Results for orders placed or performed during the hospital encounter of 01/26/21  Urine Culture   Specimen: Urine, Clean Catch  Result Value Ref Range   Specimen Description      URINE, CLEAN CATCH Performed at [facility], [address], [place], [place] [address]    Special Requests      NONE Performed at [facility], [address], [place], [place] [address]    Culture >=100,000 COLONIES/mL KLEBSIELLA PNEUMONIAE (A)    Report Status 01/28/2021 FINAL    Organism ID, Bacteria KLEBSIELLA PNEUMONIAE (A)       Susceptibility   Klebsiella pneumoniae - MIC*    AMPICILLIN >=32 RESISTANT Resistant     CEFAZOLIN <=4 SENSITIVE Sensitive     CEFEPIME <=0.12  SENSITIVE Sensitive     CEFTRIAXONE <=0.25 SENSITIVE Sensitive     CIPROFLOXACIN <=0.25 SENSITIVE Sensitive     GENTAMICIN <=1 SENSITIVE Sensitive     IMIPENEM <=0.25 SENSITIVE Sensitive     NITROFURANTOIN 64 INTERMEDIATE Intermediate     TRIMETH/SULFA <=20 SENSITIVE Sensitive     AMPICILLIN/SULBACTAM 4 SENSITIVE Sensitive     PIP/TAZO <=4 SENSITIVE Sensitive     * >=100,000 COLONIES/mL KLEBSIELLA PNEUMONIAE  Urinalysis, Routine w reflex microscopic  Result Value Ref Range   Color, Urine YELLOW YELLOW   APPearance HAZY (A) CLEAR   Specific Gravity, Urine 1.010 1.005 - 1.030   pH 5.5 5.0 - 8.0   Glucose, UA NEGATIVE NEGATIVE mg/dL   Hgb urine dipstick LARGE (A) NEGATIVE  Bilirubin Urine NEGATIVE NEGATIVE   Ketones, ur NEGATIVE NEGATIVE mg/dL   Protein, ur NEGATIVE NEGATIVE mg/dL   Nitrite NEGATIVE NEGATIVE   Leukocytes,Ua SMALL (A) NEGATIVE  Urinalysis, Microscopic (reflex)  Result Value Ref Range   RBC / HPF >50 0 - 5 RBC/hpf   WBC, UA >50 0 - 5 WBC/hpf   Bacteria, UA RARE (A) NONE SEEN   Squamous Epithelial / LPF 0-5 0 - 5   Mucus PRESENT    Budding Yeast PRESENT       Assessment & Plan:   Problem List Items Addressed This Visit       Cardiovascular and Mediastinum   Essential hypertension - Primary    Chronic, ongoing.  BP at goal on home readings.  Recommend she monitor BP at least a few mornings a week at home and document.  DASH diet at home.  Continue current medication regimen and adjust as needed, refills sent.  Labs at physical, will get scheduled.  Return in 8 weeks.          Musculoskeletal and Integument   Lumbar degenerative disc disease    Reports history of this and has occasional acute sciatic pain which muscle relaxer helps.  Will send in Flexeril 10 MG TID PRN to use as needed + Lidocaine patches.  If ongoing flare do recommend she come to office if no benefit from Flexeril.  Consider repeat imaging in future.      Relevant Medications    cyclobenzaprine (FLEXERIL) 10 MG tablet    I discussed the assessment and treatment plan with the patient. The patient was provided an opportunity to ask questions and all were answered. The patient agreed with the plan and demonstrated an understanding of the instructions.   The patient was advised to call back or seek an in-person evaluation if the symptoms worsen or if the condition fails to improve as anticipated.   I provided 21+ minutes of time during this encounter.   Follow up plan: Return in about 8 weeks (around 04/27/2021) for Annual physical.

## 2021-03-02 NOTE — Progress Notes (Signed)
Left vm asking pt to call back to schedule appt.

## 2021-03-02 NOTE — Assessment & Plan Note (Signed)
Chronic, ongoing.  BP at goal on home readings.  Recommend she monitor BP at least a few mornings a week at home and document.  DASH diet at home.  Continue current medication regimen and adjust as needed, refills sent.  Labs at physical, will get scheduled.  Return in 8 weeks.

## 2021-03-02 NOTE — Assessment & Plan Note (Signed)
Reports history of this and has occasional acute sciatic pain which muscle relaxer helps.  Will send in Flexeril 10 MG TID PRN to use as needed + Lidocaine patches.  If ongoing flare do recommend she come to office if no benefit from Flexeril.  Consider repeat imaging in future.

## 2021-03-20 NOTE — Patient Instructions (Incomplete)
DASH Eating Plan °DASH stands for Dietary Approaches to Stop Hypertension. The DASH eating plan is a healthy eating plan that has been shown to: °Reduce high blood pressure (hypertension). °Reduce your risk for type 2 diabetes, heart disease, and stroke. °Help with weight loss. °What are tips for following this plan? °Reading food labels °Check food labels for the amount of salt (sodium) per serving. Choose foods with less than 5 percent of the Daily Value of sodium. Generally, foods with less than 300 milligrams (mg) of sodium per serving fit into this eating plan. °To find whole grains, look for the word "whole" as the first word in the ingredient list. °Shopping °Buy products labeled as "low-sodium" or "no salt added." °Buy fresh foods. Avoid canned foods and pre-made or frozen meals. °Cooking °Avoid adding salt when cooking. Use salt-free seasonings or herbs instead of table salt or sea salt. Check with your health care provider or pharmacist before using salt substitutes. °Do not fry foods. Cook foods using healthy methods such as baking, boiling, grilling, roasting, and broiling instead. °Cook with heart-healthy oils, such as olive, canola, avocado, soybean, or sunflower oil. °Meal planning ° °Eat a balanced diet that includes: °4 or more servings of fruits and 4 or more servings of vegetables each day. Try to fill one-half of your plate with fruits and vegetables. °6-8 servings of whole grains each day. °Less than 6 oz (170 g) of lean meat, poultry, or fish each day. A 3-oz (85-g) serving of meat is about the same size as a deck of cards. One egg equals 1 oz (28 g). °2-3 servings of low-fat dairy each day. One serving is 1 cup (237 mL). °1 serving of nuts, seeds, or beans 5 times each week. °2-3 servings of heart-healthy fats. Healthy fats called omega-3 fatty acids are found in foods such as walnuts, flaxseeds, fortified milks, and eggs. These fats are also found in cold-water fish, such as sardines, salmon,  and mackerel. °Limit how much you eat of: °Canned or prepackaged foods. °Food that is high in trans fat, such as some fried foods. °Food that is high in saturated fat, such as fatty meat. °Desserts and other sweets, sugary drinks, and other foods with added sugar. °Full-fat dairy products. °Do not salt foods before eating. °Do not eat more than 4 egg yolks a week. °Try to eat at least 2 vegetarian meals a week. °Eat more home-cooked food and less restaurant, buffet, and fast food. °Lifestyle °When eating at a restaurant, ask that your food be prepared with less salt or no salt, if possible. °If you drink alcohol: °Limit how much you use to: °0-1 drink a day for women who are not pregnant. °0-2 drinks a day for men. °Be aware of how much alcohol is in your drink. In the [place] one drink equals one 12 oz bottle of beer (355 mL), one 5 oz glass of wine (148 mL), or one 1½ oz glass of hard liquor (44 mL). °General information °Avoid eating more than 2,300 mg of salt a day. If you have hypertension, you may need to reduce your sodium intake to 1,500 mg a day. °Work with your health care provider to maintain a healthy body weight or to lose weight. Ask what an ideal weight is for you. °Get at least 30 minutes of exercise that causes your heart to beat faster (aerobic exercise) most days of the week. Activities may include walking, swimming, or biking. °Work with your health care provider or dietitian to   adjust your eating plan to your individual calorie needs. °What foods should I eat? °Fruits °All fresh, dried, or frozen fruit. Canned fruit in natural juice (without added sugar). °Vegetables °Fresh or frozen vegetables (raw, steamed, roasted, or grilled). Low-sodium or reduced-sodium tomato and vegetable juice. Low-sodium or reduced-sodium tomato sauce and tomato paste. Low-sodium or reduced-sodium canned vegetables. °Grains °Whole-grain or whole-wheat bread. Whole-grain or whole-wheat pasta. Brown rice. Oatmeal. Quinoa.  Bulgur. Whole-grain and low-sodium cereals. Pita bread. Low-fat, low-sodium crackers. Whole-wheat flour tortillas. °Meats and other proteins °Skinless chicken or turkey. Ground chicken or turkey. Pork with fat trimmed off. Fish and seafood. Egg whites. Dried beans, peas, or lentils. Unsalted nuts, nut butters, and seeds. Unsalted canned beans. Lean cuts of beef with fat trimmed off. Low-sodium, lean precooked or cured meat, such as sausages or meat loaves. °Dairy °Low-fat (1%) or fat-free (skim) milk. Reduced-fat, low-fat, or fat-free cheeses. Nonfat, low-sodium ricotta or cottage cheese. Low-fat or nonfat yogurt. Low-fat, low-sodium cheese. °Fats and oils °Soft margarine without trans fats. Vegetable oil. Reduced-fat, low-fat, or light mayonnaise and salad dressings (reduced-sodium). Canola, safflower, olive, avocado, soybean, and sunflower oils. Avocado. °Seasonings and condiments °Herbs. Spices. Seasoning mixes without salt. °Other foods °Unsalted popcorn and pretzels. Fat-free sweets. °The items listed above may not be a complete list of foods and beverages you can eat. Contact a dietitian for more information. °What foods should I avoid? °Fruits °Canned fruit in a light or heavy syrup. Fried fruit. Fruit in cream or butter sauce. °Vegetables °Creamed or fried vegetables. Vegetables in a cheese sauce. Regular canned vegetables (not low-sodium or reduced-sodium). Regular canned tomato sauce and paste (not low-sodium or reduced-sodium). Regular tomato and vegetable juice (not low-sodium or reduced-sodium). Pickles. Olives. °Grains °Baked goods made with fat, such as croissants, muffins, or some breads. Dry pasta or rice meal packs. °Meats and other proteins °Fatty cuts of meat. Ribs. Fried meat. Bacon. Bologna, salami, and other precooked or cured meats, such as sausages or meat loaves. Fat from the back of a pig (fatback). Bratwurst. Salted nuts and seeds. Canned beans with added salt. Canned or smoked fish.  Whole eggs or egg yolks. Chicken or turkey with skin. °Dairy °Whole or 2% milk, cream, and half-and-half. Whole or full-fat cream cheese. Whole-fat or sweetened yogurt. Full-fat cheese. Nondairy creamers. Whipped toppings. Processed cheese and cheese spreads. °Fats and oils °Butter. Stick margarine. Lard. Shortening. Ghee. Bacon fat. Tropical oils, such as coconut, palm kernel, or palm oil. °Seasonings and condiments °Onion salt, garlic salt, seasoned salt, table salt, and sea salt. Worcestershire sauce. Tartar sauce. Barbecue sauce. Teriyaki sauce. Soy sauce, including reduced-sodium. Steak sauce. Canned and packaged gravies. Fish sauce. Oyster sauce. Cocktail sauce. Store-bought horseradish. Ketchup. Mustard. Meat flavorings and tenderizers. Bouillon cubes. Hot sauces. Pre-made or packaged marinades. Pre-made or packaged taco seasonings. Relishes. Regular salad dressings. °Other foods °Salted popcorn and pretzels. °The items listed above may not be a complete list of foods and beverages you should avoid. Contact a dietitian for more information. °Where to find more information °National Heart, Lung, and Blood Institute: www.nhlbi.nih.gov °American Heart Association: www.heart.org °Academy of Nutrition and Dietetics: www.eatright.org °National Kidney Foundation: www.kidney.org °Summary °The DASH eating plan is a healthy eating plan that has been shown to reduce high blood pressure (hypertension). It may also reduce your risk for type 2 diabetes, heart disease, and stroke. °When on the DASH eating plan, aim to eat more fresh fruits and vegetables, whole grains, lean proteins, low-fat dairy, and heart-healthy fats. °With the DASH   eating plan, you should limit salt (sodium) intake to 2,300 mg a day. If you have hypertension, you may need to reduce your sodium intake to 1,500 mg a day. °Work with your health care provider or dietitian to adjust your eating plan to your individual calorie needs. °This information is not  intended to replace advice given to you by your health care provider. Make sure you discuss any questions you have with your health care provider. °Document Revised: 11/29/2018 Document Reviewed: 11/29/2018 °Elsevier Patient Education © 2022 [employer] Inc. ° °

## 2021-03-25 ENCOUNTER — Encounter: Payer: 59 | Admitting: Nurse Practitioner

## 2021-03-25 DIAGNOSIS — D649 Anemia, unspecified: Secondary | ICD-10-CM

## 2021-03-25 DIAGNOSIS — I10 Essential (primary) hypertension: Secondary | ICD-10-CM

## 2021-03-25 DIAGNOSIS — R7301 Impaired fasting glucose: Secondary | ICD-10-CM

## 2021-03-25 DIAGNOSIS — Z114 Encounter for screening for human immunodeficiency virus [HIV]: Secondary | ICD-10-CM

## 2021-03-25 DIAGNOSIS — Z1159 Encounter for screening for other viral diseases: Secondary | ICD-10-CM

## 2021-03-25 DIAGNOSIS — E559 Vitamin D deficiency, unspecified: Secondary | ICD-10-CM

## 2021-03-25 DIAGNOSIS — E7800 Pure hypercholesterolemia, unspecified: Secondary | ICD-10-CM

## 2021-05-22 ENCOUNTER — Encounter: Payer: Self-pay | Admitting: Nurse Practitioner

## 2021-06-12 ENCOUNTER — Other Ambulatory Visit: Payer: Self-pay | Admitting: Nurse Practitioner

## 2021-06-13 NOTE — Telephone Encounter (Signed)
Requested medication (s) are due for refill today: yes  Requested medication (s) are on the active medication list: yes  Last refill:  05/20/20 #90/4  Future visit scheduled: no  Notes to clinic:  Unable to refill per protocol due to failed labs, no updated results.      Requested Prescriptions  Pending Prescriptions Disp Refills   lisinopril (ZESTRIL) 20 MG tablet [Pharmacy Med Name: LISINOPRIL 20 MG TABLET] 90 tablet 4    Sig: TAKE 1 TABLET BY MOUTH EVERY DAY     Cardiovascular:  ACE Inhibitors Failed - 06/12/2021  9:20 AM      Failed - Cr in normal range and within 180 days    Creatinine  Date Value Ref Range Status  12/13/2011 0.70 0.60 - 1.30 mg/dL Final   Creatinine, Ser  Date Value Ref Range Status  05/20/2020 0.75 0.57 - 1.00 mg/dL Final         Failed - K in normal range and within 180 days    Potassium  Date Value Ref Range Status  05/20/2020 4.4 3.5 - 5.2 mmol/L Final  12/13/2011 3.8 3.5 - 5.1 mmol/L Final         Failed - Last BP in normal range    BP Readings from Last 1 Encounters:  01/26/21 (!) 181/99         Passed - Patient is not pregnant      Passed - Valid encounter within last 6 months    Recent Outpatient Visits           3 months ago Essential hypertension   [place], Henrine Screws T, NP   1 year ago Encounter to establish care   [facility] [place], Barbaraann Faster, NP

## 2021-06-16 ENCOUNTER — Other Ambulatory Visit: Payer: Self-pay | Admitting: Nurse Practitioner

## 2021-06-16 NOTE — Telephone Encounter (Signed)
Requested medication (s) are due for refill today: yes  Requested medication (s) are on the active medication list: yes  Last refill:  05/20/20 for lisinopril and 03/02/20  Future visit scheduled: no  Notes to clinic:  Unable to refill per protocol, appointment needed.      Requested Prescriptions  Pending Prescriptions Disp Refills   lisinopril (ZESTRIL) 20 MG tablet [Pharmacy Med Name: LISINOPRIL 20 MG TABLET] 90 tablet 4    Sig: TAKE 1 TABLET BY MOUTH EVERY DAY     Cardiovascular:  ACE Inhibitors Failed - 06/16/2021 10:26 AM      Failed - Cr in normal range and within 180 days    Creatinine  Date Value Ref Range Status  12/13/2011 0.70 0.60 - 1.30 mg/dL Final   Creatinine, Ser  Date Value Ref Range Status  05/20/2020 0.75 0.57 - 1.00 mg/dL Final         Failed - K in normal range and within 180 days    Potassium  Date Value Ref Range Status  05/20/2020 4.4 3.5 - 5.2 mmol/L Final  12/13/2011 3.8 3.5 - 5.1 mmol/L Final         Failed - Last BP in normal range    BP Readings from Last 1 Encounters:  01/26/21 (!) 181/99         Passed - Patient is not pregnant      Passed - Valid encounter within last 6 months    Recent Outpatient Visits           3 months ago Essential hypertension   [place], Henrine Screws T, NP   1 year ago Encounter to establish care   [facility] [place], [place] T, NP               lidocaine (LIDODERM) 5 % [Pharmacy Med Name: LIDOCAINE 5% PATCH] 30 patch 0    Sig: Place 1 patch onto the skin daily. Remove & Discard patch within 12 hours or as directed by MD     Analgesics:  Topicals Failed - 06/16/2021 10:26 AM      Failed - Manual Review: Labs are only required if the patient has taken medication for more than 8 weeks.      Failed - PLT in normal range and within 360 days    Platelets  Date Value Ref Range Status  10/02/2016 541 (H) 150 - 440 K/uL Final   Platelet  Date Value Ref Range Status   12/13/2011 419 150 - 440 x10 3/mm 3 Final         Failed - HGB in normal range and within 360 days    Hemoglobin  Date Value Ref Range Status  10/02/2016 10.9 (L) 12.0 - 16.0 g/dL Final   HGB  Date Value Ref Range Status  12/13/2011 11.7 (L) 12.0 - 16.0 g/dL Final         Failed - HCT in normal range and within 360 days    HCT  Date Value Ref Range Status  10/02/2016 34.2 (L) 35.0 - 47.0 % Final  12/13/2011 36.0 35.0 - 47.0 % Final         Failed - Cr in normal range and within 360 days    Creatinine  Date Value Ref Range Status  12/13/2011 0.70 0.60 - 1.30 mg/dL Final   Creatinine, Ser  Date Value Ref Range Status  05/20/2020 0.75 0.57 - 1.00 mg/dL Final         Failed -  eGFR is 30 or above and within 360 days    EGFR (African American)  Date Value Ref Range Status  12/13/2011 >60  Final   GFR calc Af Amer  Date Value Ref Range Status  10/02/2016 >60 >60 mL/min Final    Comment:    (NOTE) The eGFR has been calculated using the CKD EPI equation. This calculation has not been validated in all clinical situations. eGFR's persistently <60 mL/min signify possible Chronic Kidney Disease.    EGFR (Non-African Amer.)  Date Value Ref Range Status  12/13/2011 >60  Final    Comment:    eGFR values <31m/min/1.73 m2 may be an indication of chronic kidney disease (CKD). Calculated eGFR is useful in patients with stable renal function. The eGFR calculation will not be reliable in acutely ill patients when serum creatinine is changing rapidly. It is not useful in  patients on dialysis. The eGFR calculation may not be applicable to patients at the low and high extremes of body sizes, pregnant women, and vegetarians.    GFR calc non Af Amer  Date Value Ref Range Status  10/02/2016 >60 >60 mL/min Final   eGFR  Date Value Ref Range Status  05/20/2020 95 >59 mL/min/1.73 Final         Passed - Patient is not pregnant      Passed - Valid encounter within last 12  months    Recent Outpatient Visits           3 months ago Essential hypertension   [place] Scre[place], NP   1 year ago [facility]

## 2021-06-20 NOTE — Telephone Encounter (Signed)
LVM asking patient to call back to schedule an appointment 

## 2021-06-21 ENCOUNTER — Other Ambulatory Visit: Payer: 59

## 2021-06-22 NOTE — Telephone Encounter (Signed)
2nd attempt to reach patient. Went straight to vm

## 2021-07-12 ENCOUNTER — Other Ambulatory Visit: Payer: Self-pay | Admitting: Nurse Practitioner

## 2021-07-13 NOTE — Telephone Encounter (Signed)
Requested medication (s) are due for refill today: yes  Requested medication (s) are on the active medication list: yes  Last refill:  06/16/21 #60 0 refills  Future visit scheduled: no  Notes to clinic:  protocol failed last labs 05/20/20. Do you want to refill Rx?     Requested Prescriptions  Pending Prescriptions Disp Refills   lisinopril (ZESTRIL) 20 MG tablet [Pharmacy Med Name: LISINOPRIL 20 MG TABLET] 30 tablet 1    Sig: TAKE 1 TABLET BY MOUTH EVERY DAY     Cardiovascular:  ACE Inhibitors Failed - 07/12/2021  9:32 AM      Failed - Cr in normal range and within 180 days    Creatinine  Date Value Ref Range Status  12/13/2011 0.70 0.60 - 1.30 mg/dL Final   Creatinine, Ser  Date Value Ref Range Status  05/20/2020 0.75 0.57 - 1.00 mg/dL Final         Failed - K in normal range and within 180 days    Potassium  Date Value Ref Range Status  05/20/2020 4.4 3.5 - 5.2 mmol/L Final  12/13/2011 3.8 3.5 - 5.1 mmol/L Final         Failed - Last BP in normal range    BP Readings from Last 1 Encounters:  01/26/21 (!) 181/99         Passed - Patient is not pregnant      Passed - Valid encounter within last 6 months    Recent Outpatient Visits           4 months ago Essential hypertension   [place], Henrine Screws T, NP   1 year ago Encounter to establish care   [facility] [place], Barbaraann Faster, NP

## 2021-08-24 ENCOUNTER — Other Ambulatory Visit: Payer: Self-pay | Admitting: Nurse Practitioner

## 2021-08-24 NOTE — Telephone Encounter (Signed)
Requested Prescriptions  Pending Prescriptions Disp Refills  . lisinopril (ZESTRIL) 20 MG tablet [Pharmacy Med Name: LISINOPRIL 20 MG TABLET] 90 tablet 0    Sig: TAKE 1 TABLET BY MOUTH EVERY DAY     Cardiovascular:  ACE Inhibitors Failed - 08/24/2021  9:33 AM      Failed - Cr in normal range and within 180 days    Creatinine  Date Value Ref Range Status  12/13/2011 0.70 0.60 - 1.30 mg/dL Final   Creatinine, Ser  Date Value Ref Range Status  05/20/2020 0.75 0.57 - 1.00 mg/dL Final         Failed - K in normal range and within 180 days    Potassium  Date Value Ref Range Status  05/20/2020 4.4 3.5 - 5.2 mmol/L Final  12/13/2011 3.8 3.5 - 5.1 mmol/L Final         Failed - Last BP in normal range    BP Readings from Last 1 Encounters:  01/26/21 (!) 181/99         Passed - Patient is not pregnant      Passed - Valid encounter within last 6 months    Recent Outpatient Visits          5 months ago Essential hypertension   [place], Henrine Screws T, NP   1 year ago Encounter to establish care   [facility] [address], Barbaraann Faster, NP

## 2021-08-26 ENCOUNTER — Other Ambulatory Visit: Payer: Self-pay | Admitting: Nurse Practitioner

## 2021-08-29 NOTE — Telephone Encounter (Signed)
Requested medication (s) are due for refill today: yes  Requested medication (s) are on the active medication list: yes  Last refill:  03/02/21 # 45 4 RF  Future visit scheduled: no  Notes to clinic:  med not delegated to NT to RF   Requested Prescriptions  Pending Prescriptions Disp Refills   cyclobenzaprine (FLEXERIL) 10 MG tablet [Pharmacy Med Name: CYCLOBENZAPRINE 10 MG TABLET] 45 tablet 4    Sig: TAKE 1 TABLET BY MOUTH THREE TIMES A DAY AS NEEDED FOR MUSCLE SPASMS     Not Delegated - Analgesics:  Muscle Relaxants Failed - 08/26/2021 10:11 PM      Failed - This refill cannot be delegated      Passed - Valid encounter within last 6 months    Recent Outpatient Visits           6 months ago Essential hypertension   [place], Henrine Screws T, NP   1 year ago Encounter to establish care   [facility] [place], Barbaraann Faster, NP

## 2021-08-29 NOTE — Telephone Encounter (Signed)
Patient is overdue for an appointment. Please call to schedule and then route to provider for refill.

## 2021-08-29 NOTE — Telephone Encounter (Signed)
LVM asking patient to call back to schedule an appointment 

## 2021-08-30 NOTE — Telephone Encounter (Signed)
2nd attempt to reach patient to schedule an appointment

## 2021-09-02 NOTE — Telephone Encounter (Signed)
Sent patient a mychart message.

## 2021-10-13 ENCOUNTER — Other Ambulatory Visit: Payer: Self-pay | Admitting: Nurse Practitioner

## 2021-10-13 NOTE — Telephone Encounter (Signed)
Requested medication (s) are due for refill today - yes  Requested medication (s) are on the active medication list -yes  Future visit scheduled -no  Last refill: 09/02/21 #45 1RF  Notes to clinic: non delegated Rx  Requested Prescriptions  Pending Prescriptions Disp Refills   cyclobenzaprine (FLEXERIL) 10 MG tablet [Pharmacy Med Name: CYCLOBENZAPRINE 10 MG TABLET] 45 tablet 1    Sig: TAKE 1 TABLET BY MOUTH THREE TIMES A DAY AS NEEDED FOR MUSCLE SPASM     Not Delegated - Analgesics:  Muscle Relaxants Failed - 10/13/2021 12:07 PM      Failed - This refill cannot be delegated      Failed - Valid encounter within last 6 months    Recent Outpatient Visits           7 months ago Essential hypertension   [place], Henrine Screws T, NP   1 year ago Encounter to establish care   [facility] [place], Barbaraann Faster, NP                 Requested Prescriptions  Pending Prescriptions Disp Refills   cyclobenzaprine (FLEXERIL) 10 MG tablet [Pharmacy Med Name: CYCLOBENZAPRINE 10 MG TABLET] 45 tablet 1    Sig: TAKE 1 TABLET BY MOUTH THREE TIMES A DAY AS NEEDED FOR MUSCLE SPASM     Not Delegated - Analgesics:  Muscle Relaxants Failed - 10/13/2021 12:07 PM      Failed - This refill cannot be delegated      Failed - Valid encounter within last 6 months    Recent Outpatient Visits           7 months ago Essential hypertension   [place], Henrine Screws T, NP   1 year ago Encounter to establish care   [facility] [place], Barbaraann Faster, NP

## 2021-10-30 ENCOUNTER — Emergency Department: Payer: 59

## 2021-10-30 ENCOUNTER — Encounter: Payer: Self-pay | Admitting: Emergency Medicine

## 2021-10-30 DIAGNOSIS — Z5321 Procedure and treatment not carried out due to patient leaving prior to being seen by health care provider: Secondary | ICD-10-CM | POA: Insufficient documentation

## 2021-10-30 DIAGNOSIS — R0789 Other chest pain: Secondary | ICD-10-CM | POA: Insufficient documentation

## 2021-10-30 DIAGNOSIS — R079 Chest pain, unspecified: Secondary | ICD-10-CM | POA: Diagnosis not present

## 2021-10-30 DIAGNOSIS — R059 Cough, unspecified: Secondary | ICD-10-CM | POA: Diagnosis not present

## 2021-10-30 DIAGNOSIS — R0981 Nasal congestion: Secondary | ICD-10-CM | POA: Diagnosis not present

## 2021-10-30 DIAGNOSIS — R0602 Shortness of breath: Secondary | ICD-10-CM | POA: Diagnosis not present

## 2021-10-30 LAB — BASIC METABOLIC PANEL
Anion gap: 7 (ref 5–15)
BUN: 19 mg/dL (ref 6–20)
CO2: 25 mmol/L (ref 22–32)
Calcium: 9.2 mg/dL (ref 8.9–10.3)
Chloride: 108 mmol/L (ref 98–111)
Creatinine, Ser: 0.63 mg/dL (ref 0.44–1.00)
GFR, Estimated: >60 mL/min (ref >60)
Glucose, Bld: 109 mg/dL — ABNORMAL HIGH (ref 70–99)
Potassium: 3.8 mmol/L (ref 3.5–5.1)
Sodium: 140 mmol/L (ref 135–145)

## 2021-10-30 LAB — CBC
HCT: 44.4 % (ref 36.0–46.0)
Hemoglobin: 14 g/dL (ref 12.0–15.0)
MCH: 28.5 pg (ref 26.0–34.0)
MCHC: 31.5 g/dL (ref 30.0–36.0)
MCV: 90.4 fL (ref 80.0–100.0)
Platelets: 399 10*3/uL (ref 150–400)
RBC: 4.91 MIL/uL (ref 3.87–5.11)
RDW: 12.4 % (ref 11.5–15.5)
WBC: 8.2 10*3/uL (ref 4.0–10.5)
nRBC: 0 % (ref 0.0–0.2)

## 2021-10-30 LAB — TROPONIN I (HIGH SENSITIVITY): Troponin I (High Sensitivity): 3 ng/L (ref <18)

## 2021-10-30 NOTE — ED Triage Notes (Signed)
Pt c/o left sided chest pain that radiates into back, shoulder and up left side of neck x1 day. Pt reports recent illness last week with cough and congestion.

## 2021-10-31 ENCOUNTER — Encounter: Payer: Self-pay | Admitting: Nurse Practitioner

## 2021-10-31 ENCOUNTER — Emergency Department
Admission: EM | Admit: 2021-10-31 | Discharge: 2021-10-31 | Discharge status: Against Medical Advice | Payer: 59 | Attending: Emergency Medicine | Admitting: Emergency Medicine

## 2021-10-31 NOTE — ED Notes (Signed)
No answer when called for rooming  x1

## 2021-11-21 ENCOUNTER — Other Ambulatory Visit: Payer: Self-pay | Admitting: Nurse Practitioner

## 2021-11-21 NOTE — Telephone Encounter (Signed)
Called pt [id number] for office visit. 

## 2021-11-21 NOTE — Telephone Encounter (Signed)
Courtesy refill. Patient will need an office visit for further refills. Requested Prescriptions  Pending Prescriptions Disp Refills   lisinopril (ZESTRIL) 20 MG tablet [Pharmacy Med Name: LISINOPRIL 20 MG TABLET] 30 tablet 0    Sig: TAKE 1 TABLET BY MOUTH EVERY DAY     Cardiovascular:  ACE Inhibitors Failed - 11/21/2021  1:56 AM      Failed - Last BP in normal range    BP Readings from Last 1 Encounters:  10/30/21 (!) 134/98         Failed - Valid encounter within last 6 months    Recent Outpatient Visits           8 months ago Essential hypertension   [place], Henrine Screws T, NP   1 year ago Encounter to establish care   [place], Henrine Screws T, NP              Passed - Cr in normal range and within 180 days    Creatinine  Date Value Ref Range Status  12/13/2011 0.70 0.60 - 1.30 mg/dL Final   Creatinine, Ser  Date Value Ref Range Status  10/30/2021 0.63 0.44 - 1.00 mg/dL Final         Passed - K in normal range and within 180 days    Potassium  Date Value Ref Range Status  10/30/2021 3.8 3.5 - 5.1 mmol/L Final  12/13/2011 3.8 3.5 - 5.1 mmol/L Final         Passed - Patient is not pregnant

## 2021-11-30 ENCOUNTER — Other Ambulatory Visit: Payer: Self-pay | Admitting: Nurse Practitioner

## 2021-11-30 NOTE — Telephone Encounter (Signed)
Requested medication (s) are due for refill today:   Provider to review  Requested medication (s) are on the active medication list:   Yes  Future visit scheduled:   No   Last ordered: 10/13/2021 #45, 1 refill  Non delegated refill    Requested Prescriptions  Pending Prescriptions Disp Refills   cyclobenzaprine (FLEXERIL) 10 MG tablet [Pharmacy Med Name: CYCLOBENZAPRINE 10 MG TABLET] 45 tablet 1    Sig: TAKE 1 TABLET BY MOUTH THREE TIMES A DAY AS NEEDED FOR MUSCLE SPASM     Not Delegated - Analgesics:  Muscle Relaxants Failed - 11/30/2021  2:19 PM      Failed - This refill cannot be delegated      Failed - Valid encounter within last 6 months    Recent Outpatient Visits           9 months ago Essential hypertension   [place], Henrine Screws T, NP   1 year ago Encounter to establish care   [facility] [place], Barbaraann Faster, NP

## 2021-12-06 NOTE — Telephone Encounter (Signed)
Called patient to schedule appointment and notify her of prescription sent in no anwer.

## 2021-12-06 NOTE — Telephone Encounter (Signed)
Noted  

## 2021-12-20 ENCOUNTER — Other Ambulatory Visit: Payer: Self-pay | Admitting: Nurse Practitioner

## 2021-12-20 NOTE — Telephone Encounter (Signed)
Requested medications are due for refill today.  yes  Requested medications are on the active medications list. yes  Last refill. 11/21/2021 #30 0 rf  Future visit scheduled.   no  Notes to clinic.  Pt overdue for OV. Courtesy refill already given.    Requested Prescriptions  Pending Prescriptions Disp Refills   lisinopril (ZESTRIL) 20 MG tablet [Pharmacy Med Name: LISINOPRIL 20 MG TABLET] 90 tablet 1    Sig: TAKE 1 TABLET BY MOUTH EVERY DAY     Cardiovascular:  ACE Inhibitors Failed - 12/20/2021  1:31 PM      Failed - Last BP in normal range    BP Readings from Last 1 Encounters:  10/30/21 (!) 134/98         Failed - Valid encounter within last 6 months    Recent Outpatient Visits           9 months ago Essential hypertension   [place], Henrine Screws T, NP   1 year ago Encounter to establish care   [place], Henrine Screws T, NP              Passed - Cr in normal range and within 180 days    Creatinine  Date Value Ref Range Status  12/13/2011 0.70 0.60 - 1.30 mg/dL Final   Creatinine, Ser  Date Value Ref Range Status  10/30/2021 0.63 0.44 - 1.00 mg/dL Final         Passed - K in normal range and within 180 days    Potassium  Date Value Ref Range Status  10/30/2021 3.8 3.5 - 5.1 mmol/L Final  12/13/2011 3.8 3.5 - 5.1 mmol/L Final         Passed - Patient is not pregnant

## 2021-12-22 NOTE — Telephone Encounter (Signed)
LVM asking patient to call back to schedule an appointment 

## 2022-01-02 ENCOUNTER — Other Ambulatory Visit: Payer: Self-pay | Admitting: Nurse Practitioner

## 2022-01-04 ENCOUNTER — Other Ambulatory Visit: Payer: Self-pay | Admitting: Nurse Practitioner

## 2022-01-04 NOTE — Telephone Encounter (Signed)
Requested Prescriptions  Pending Prescriptions Disp Refills   lidocaine (LIDODERM) 5 % [Pharmacy Med Name: LIDOCAINE 5% PATCH] 90 patch 0    Sig: PLACE 1 PATCH ONTO THE SKIN DAILY. REMOVE & DISCARD PATCH WITHIN 12 HOURS OR AS DIRECTED BY MD     Analgesics:  Topicals Failed - 01/02/2022  1:05 PM      Failed - Manual Review: Labs are only required if the patient has taken medication for more than 8 weeks.      Passed - PLT in normal range and within 360 days    Platelets  Date Value Ref Range Status  10/30/2021 399 150 - 400 K/uL Final   Platelet  Date Value Ref Range Status  12/13/2011 419 150 - 440 x10 3/mm 3 Final         Passed - HGB in normal range and within 360 days    Hemoglobin  Date Value Ref Range Status  10/30/2021 14.0 12.0 - 15.0 g/dL Final   HGB  Date Value Ref Range Status  12/13/2011 11.7 (L) 12.0 - 16.0 g/dL Final         Passed - HCT in normal range and within 360 days    HCT  Date Value Ref Range Status  10/30/2021 44.4 36.0 - 46.0 % Final  12/13/2011 36.0 35.0 - 47.0 % Final         Passed - Cr in normal range and within 360 days    Creatinine  Date Value Ref Range Status  12/13/2011 0.70 0.60 - 1.30 mg/dL Final   Creatinine, Ser  Date Value Ref Range Status  10/30/2021 0.63 0.44 - 1.00 mg/dL Final         Passed - eGFR is 30 or above and within 360 days    EGFR (African American)  Date Value Ref Range Status  12/13/2011 >60  Final   GFR calc Af Amer  Date Value Ref Range Status  10/02/2016 >60 >60 mL/min Final    Comment:    (NOTE) The eGFR has been calculated using the CKD EPI equation. This calculation has not been validated in all clinical situations. eGFR's persistently <60 mL/min signify possible Chronic Kidney Disease.    EGFR (Non-African Amer.)  Date Value Ref Range Status  12/13/2011 >60  Final    Comment:    eGFR values <37m/min/1.73 m2 may be an indication of chronic kidney disease (CKD). Calculated eGFR is useful in  patients with stable renal function. The eGFR calculation will not be reliable in acutely ill patients when serum creatinine is changing rapidly. It is not useful in  patients on dialysis. The eGFR calculation may not be applicable to patients at the low and high extremes of body sizes, pregnant women, and vegetarians.    GFR, Estimated  Date Value Ref Range Status  10/30/2021 >60 >60 mL/min Final    Comment:    (NOTE) Calculated using the CKD-EPI Creatinine Equation (2021)    eGFR  Date Value Ref Range Status  05/20/2020 95 >59 mL/min/1.73 Final         Passed - Patient is not pregnant      Passed - Valid encounter within last 12 months    Recent Outpatient Visits           10 months ago Essential hypertension   [place] ScrewsT, NP   1 year ago [facility]

## 2022-01-06 NOTE — Telephone Encounter (Signed)
Requested medication (s) are due for refill today: Alternative Requested:PRIOR AUTH.   Requested medication (s) are on the active medication list: yes  Last refill:  01/04/22  Future visit scheduled: yes  Notes to clinic:  Alternative Requested:PRIOR AUTH.      Requested Prescriptions  Pending Prescriptions Disp Refills   lidocaine (LIDODERM) 5 % [Pharmacy Med Name: LIDOCAINE 5% PATCH] 90 patch 0    Sig: PLACE 1 PATCH ONTO THE SKIN DAILY. REMOVE & DISCARD PATCH WITHIN 12 HOURS OR AS DIRECTED BY MD     Analgesics:  Topicals Failed - 01/04/2022  4:31 PM      Failed - Manual Review: Labs are only required if the patient has taken medication for more than 8 weeks.      Passed - PLT in normal range and within 360 days    Platelets  Date Value Ref Range Status  10/30/2021 399 150 - 400 K/uL Final   Platelet  Date Value Ref Range Status  12/13/2011 419 150 - 440 x10 3/mm 3 Final         Passed - HGB in normal range and within 360 days    Hemoglobin  Date Value Ref Range Status  10/30/2021 14.0 12.0 - 15.0 g/dL Final   HGB  Date Value Ref Range Status  12/13/2011 11.7 (L) 12.0 - 16.0 g/dL Final         Passed - HCT in normal range and within 360 days    HCT  Date Value Ref Range Status  10/30/2021 44.4 36.0 - 46.0 % Final  12/13/2011 36.0 35.0 - 47.0 % Final         Passed - Cr in normal range and within 360 days    Creatinine  Date Value Ref Range Status  12/13/2011 0.70 0.60 - 1.30 mg/dL Final   Creatinine, Ser  Date Value Ref Range Status  10/30/2021 0.63 0.44 - 1.00 mg/dL Final         Passed - eGFR is 30 or above and within 360 days    EGFR (African American)  Date Value Ref Range Status  12/13/2011 >60  Final   GFR calc Af Amer  Date Value Ref Range Status  10/02/2016 >60 >60 mL/min Final    Comment:    (NOTE) The eGFR has been calculated using the CKD EPI equation. This calculation has not been validated in all clinical situations. eGFR's  persistently <60 mL/min signify possible Chronic Kidney Disease.    EGFR (Non-African Amer.)  Date Value Ref Range Status  12/13/2011 >60  Final    Comment:    eGFR values <77m/min/1.73 m2 may be an indication of chronic kidney disease (CKD). Calculated eGFR is useful in patients with stable renal function. The eGFR calculation will not be reliable in acutely ill patients when serum creatinine is changing rapidly. It is not useful in  patients on dialysis. The eGFR calculation may not be applicable to patients at the low and high extremes of body sizes, pregnant women, and vegetarians.    GFR, Estimated  Date Value Ref Range Status  10/30/2021 >60 >60 mL/min Final    Comment:    (NOTE) Calculated using the CKD-EPI Creatinine Equation (2021)    eGFR  Date Value Ref Range Status  05/20/2020 95 >59 mL/min/1.73 Final         Passed - Patient is not pregnant      Passed - Valid encounter within last 12 months    Recent Outpatient Visits  10 months ago Essential hypertension   [place], Henrine Screws T, NP   1 year ago Encounter to establish care   [facility] [place], Barbaraann Faster, NP

## 2022-01-21 ENCOUNTER — Telehealth: Payer: Self-pay | Admitting: Nurse Practitioner

## 2022-01-23 NOTE — Telephone Encounter (Signed)
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  Wants to know if she can receive enough to last her until her scheduled time. She has enough for 6 more days

## 2022-01-23 NOTE — Telephone Encounter (Signed)
Requested medication (s) are due for refill today:routing for review  Requested medication (s) are on the active medication list:yes  Last refill:  12/21/21  Future visit scheduled: no  Notes to clinic:  Unable to refill per protocol, courtesy refill already given, routing for provider approval.      Requested Prescriptions  Pending Prescriptions Disp Refills   lisinopril (ZESTRIL) 20 MG tablet [Pharmacy Med Name: LISINOPRIL 20 MG TABLET] 90 tablet 1    Sig: TAKE 1 TABLET BY MOUTH EVERY DAY     Cardiovascular:  ACE Inhibitors Failed - 01/21/2022 10:31 AM      Failed - Last BP in normal range    BP Readings from Last 1 Encounters:  10/30/21 (!) 134/98         Failed - Valid encounter within last 6 months    Recent Outpatient Visits           10 months ago Essential hypertension   [place], Henrine Screws T, NP   1 year ago Encounter to establish care   [place], Henrine Screws T, NP              Passed - Cr in normal range and within 180 days    Creatinine  Date Value Ref Range Status  12/13/2011 0.70 0.60 - 1.30 mg/dL Final   Creatinine, Ser  Date Value Ref Range Status  10/30/2021 0.63 0.44 - 1.00 mg/dL Final         Passed - K in normal range and within 180 days    Potassium  Date Value Ref Range Status  10/30/2021 3.8 3.5 - 5.1 mmol/L Final  12/13/2011 3.8 3.5 - 5.1 mmol/L Final         Passed - Patient is not pregnant

## 2022-01-24 ENCOUNTER — Ambulatory Visit: Payer: Self-pay

## 2022-01-24 MED ORDER — LISINOPRIL 20 MG PO TABS: 20.0000 mg | ORAL_TABLET | Freq: Every day | ORAL | 0 refills | Status: DC

## 2022-01-24 NOTE — Telephone Encounter (Signed)
Pt reports Covid pos at workplace Friday. Advised to retest today, did home test and positive.  States she needs a note form PCP,out of work. Reviewed self isolation guidelines with pt, verbalizes understanding.  Pt has a MyChart account if appropriate to send via MyChart, workplace will accept.  CB# [phone] Please advise. Reason for Disposition  [1] Caller requesting NON-URGENT health information AND [2] PCP's office is the best resource  Answer Assessment - Initial Assessment Questions 1. REASON FOR CALL or QUESTION: "What is your reason for calling today?" or "How can I best help you?" or "What question do you have that I can help answer?"     Note from PCP  Protocols used: Information Only Call - No Triage-A-AH

## 2022-01-24 NOTE — Telephone Encounter (Signed)
Called patient to let her an appointment is needed. Can be virtual

## 2022-01-24 NOTE — Telephone Encounter (Signed)
Summary: positive covid   Pt called saying she tested positive for Covid.  She has been sick since Friday.  She needs a note for work but [facility] does not have any appts until Thursday.  CB#  [phone].          Called pt left message on machine to call back.

## 2022-01-24 NOTE — Telephone Encounter (Signed)
Pt scheduled 11/8

## 2022-01-25 ENCOUNTER — Other Ambulatory Visit: Payer: Self-pay | Admitting: Nurse Practitioner

## 2022-01-25 NOTE — Telephone Encounter (Signed)
Requested medication (s) are due for refill today:   Yes  Requested medication (s) are on the active medication list:   Yes  Future visit scheduled:   Yes tomorrow 01/26/2022   Last ordered: 01/24/2022 #90, 0 refills sent to [facility] in [place].     [place] in [place] making this request for refill.  Returned because per protocol  she hasn't had an encounter within 6 months so unable to refill.    Requested Prescriptions  Pending Prescriptions Disp Refills   lisinopril (ZESTRIL) 20 MG tablet [Pharmacy Med Name: LISINOPRIL 20 MG TABLET] 30 tablet     Sig: TAKE 1 TABLET BY MOUTH EVERY DAY     Cardiovascular:  ACE Inhibitors Failed - 01/25/2022  1:03 PM      Failed - Last BP in normal range    BP Readings from Last 1 Encounters:  10/30/21 (!) 134/98         Failed - Valid encounter within last 6 months    Recent Outpatient Visits           10 months ago Essential hypertension   [place], Henrine Screws T, NP   1 year ago Encounter to establish care   [place], Henrine Screws T, NP       Future Appointments             Tomorrow Venita Lick, NP [place], [place]   In 2 weeks [place], Barbaraann Faster, NP [employer], [facility]            Passed - Cr in normal range and within 180 days    Creatinine  Date Value Ref Range Status  12/13/2011 0.70 0.60 - 1.30 mg/dL Final   Creatinine, Ser  Date Value Ref Range Status  10/30/2021 0.63 0.44 - 1.00 mg/dL Final         Passed - K in normal range and within 180 days    Potassium  Date Value Ref Range Status  10/30/2021 3.8 3.5 - 5.1 mmol/L Final  12/13/2011 3.8 3.5 - 5.1 mmol/L Final         Passed - Patient is not pregnant

## 2022-01-26 ENCOUNTER — Encounter: Payer: Self-pay | Admitting: Nurse Practitioner

## 2022-01-26 ENCOUNTER — Telehealth (INDEPENDENT_AMBULATORY_CARE_PROVIDER_SITE_OTHER): Payer: 59 | Admitting: Nurse Practitioner

## 2022-01-26 DIAGNOSIS — U071 COVID-19: Secondary | ICD-10-CM | POA: Diagnosis not present

## 2022-01-26 MED ORDER — BENZONATATE 100 MG PO CAPS: 100.0000 mg | ORAL_CAPSULE | Freq: Three times a day (TID) | ORAL | 0 refills | Status: DC | PRN

## 2022-01-26 MED ORDER — GUAIFENESIN-CODEINE 100-10 MG/5ML PO SOLN: 5.0000 mL | Freq: Two times a day (BID) | ORAL | 0 refills | Status: DC | PRN

## 2022-01-26 MED ORDER — PREDNISONE 20 MG PO TABS: 40.0000 mg | ORAL_TABLET | Freq: Every day | ORAL | 0 refills | Status: AC

## 2022-01-26 MED ORDER — AZITHROMYCIN 250 MG PO TABS: ORAL_TABLET | ORAL | 0 refills | Status: AC

## 2022-01-26 NOTE — Assessment & Plan Note (Signed)
Acute with symptoms starting on 01/20/22, tested positive yesterday. Is past treatment period for antiviral.  At this time will start Prednisone 40 MG daily for 5 days + Tessalon and cough syrup as needed.  Tomorrow will be day 7 of symptoms, have sent in [place] and alerted her if not feeling better over weekend to initiate this.  Work note provided.  Recommend masking for 5 days upon return and: - Increased rest - Increasing Fluids - Acetaminophen as needed for fever/pain.  - Salt water gargling, chloraseptic spray and throat lozenges - Mucinex.  - Humidifying the air.

## 2022-01-26 NOTE — Patient Instructions (Signed)

## 2022-01-26 NOTE — Progress Notes (Signed)
LMP 08/24/2018    Subjective:    Patient ID: Christine Chase, female    DOB: 1965/05/12, 57 y.o.   MRN: [id number]  HPI: Christine Chase is a 57 y.o. female  Chief Complaint  Patient presents with   Covid Positive    Tested positive on Tuesday morning at her work. Still having a cough at night, nasal congestion and has not taste or smell   This visit was completed via video visit through MyChart due to the restrictions of the COVID-19 pandemic. All issues as above were discussed and addressed. Physical exam was done as above through visual confirmation on video through MyChart. If it was felt that the patient should be evaluated in the office, they were directed there. The patient verbally consented to this visit. Location of the patient: home Location of the provider: work Those involved with this call:  Provider: Marnee Guarneri, DNP CMA: Frazier Butt, [place] Desk/Registration: [employer]  Time spent on call:  21 minutes with patient face to face via video conference. More than 50% of this time was spent in counseling and coordination of care. 15 minutes total spent in review of patient's record and preparation of their chart.  I verified patient identity using two factors (patient name and date of birth). Patient consents verbally to being seen via telemedicine visit today.    COVID POSITIVE Tested positive Tuesday morning, her symptoms started 01/20/22. Cough is worse at night.  Has not been Covid vaccinated. Has loss sense of smell and taste. Fever: no Cough: yes Shortness of breath: occasional Wheezing: no Chest pain: no Chest tightness: no Chest congestion: no Nasal congestion: yes Runny nose: yes Post nasal drip: yes Sneezing: no Sore throat:  initially Swollen glands: no Sinus pressure: no Headache: yes Face pain: no Toothache: no Ear pain: none Ear pressure: none Eyes red/itching:no Eye drainage/crusting: no  Vomiting: no Rash: no Fatigue: yes Sick  contacts: yes Strep contacts: no  Context: fluctuating Recurrent sinusitis: no Relief with OTC cold/cough medications: yes  Treatments attempted: Goodies    Relevant past medical, surgical, family and social history reviewed and updated as indicated. Interim medical history since our last visit reviewed. Allergies and medications reviewed and updated.  Review of Systems  Constitutional:  Positive for fatigue. Negative for activity change, appetite change, chills and fever.  HENT:  Positive for congestion, postnasal drip, rhinorrhea and sore throat. Negative for ear discharge, ear pain, facial swelling, sinus pressure, sinus pain, sneezing and voice change.   Eyes:  Negative for pain and visual disturbance.  Respiratory:  Positive for cough and shortness of breath. Negative for chest tightness and wheezing.   Cardiovascular:  Negative for chest pain, palpitations and leg swelling.  Gastrointestinal: Negative.   Neurological:  Positive for headaches. Negative for dizziness and numbness.  Psychiatric/Behavioral: Negative.     Per HPI unless specifically indicated above     Objective:    LMP 08/24/2018   Wt Readings from Last 3 Encounters:  10/30/21 183 lb (83 kg)  01/26/21 175 lb (79.4 kg)  05/20/20 188 lb 6.4 oz (85.5 kg)    Physical Exam Vitals and nursing note reviewed.  Constitutional:      General: She is awake. She is not in acute distress.    Appearance: She is well-developed and well-groomed. She is ill-appearing. She is not toxic-appearing.  HENT:     Head: Normocephalic.     Right Ear: Hearing normal.     Left Ear: Hearing normal.  Eyes:     General: Lids are normal.        Right eye: No discharge.        Left eye: No discharge.     Conjunctiva/sclera: Conjunctivae normal.  Pulmonary:     Effort: Pulmonary effort is normal. No accessory muscle usage or respiratory distress.  Musculoskeletal:     Cervical back: Normal range of motion.  Neurological:     Mental  Status: She is alert and oriented to person, place, and time.  Psychiatric:        Attention and Perception: Attention normal.        Mood and Affect: Mood normal.        Behavior: Behavior normal. Behavior is cooperative.        Thought Content: Thought content normal.        Judgment: Judgment normal.    Results for orders placed or performed during the hospital encounter of 40/98/11  Basic metabolic panel  Result Value Ref Range   Sodium 140 135 - 145 mmol/L   Potassium 3.8 3.5 - 5.1 mmol/L   Chloride 108 98 - 111 mmol/L   CO2 25 22 - 32 mmol/L   Glucose, Bld 109 (H) 70 - 99 mg/dL   BUN 19 6 - 20 mg/dL   Creatinine, Ser 0.63 0.44 - 1.00 mg/dL   Calcium 9.2 8.9 - 10.3 mg/dL   GFR, Estimated >60 >60 mL/min   Anion gap 7 5 - 15  CBC  Result Value Ref Range   WBC 8.2 4.0 - 10.5 K/uL   RBC 4.91 3.87 - 5.11 MIL/uL   Hemoglobin 14.0 12.0 - 15.0 g/dL   HCT 44.4 36.0 - 46.0 %   MCV 90.4 80.0 - 100.0 fL   MCH 28.5 26.0 - 34.0 pg   MCHC 31.5 30.0 - 36.0 g/dL   RDW 12.4 11.5 - 15.5 %   Platelets 399 150 - 400 K/uL   nRBC 0.0 0.0 - 0.2 %  Troponin I (High Sensitivity)  Result Value Ref Range   Troponin I (High Sensitivity) 3 <18 ng/L      Assessment & Plan:   Problem List Items Addressed This Visit       Other   Lab test positive for detection of COVID-19 virus - Primary    Acute with symptoms starting on 01/20/22, tested positive yesterday. Is past treatment period for antiviral.  At this time will start Prednisone 40 MG daily for 5 days + Tessalon and cough syrup as needed.  Tomorrow will be day 7 of symptoms, have sent in [place] and alerted her if not feeling better over weekend to initiate this.  Work note provided.  Recommend masking for 5 days upon return and: - Increased rest - Increasing Fluids - Acetaminophen as needed for fever/pain.  - Salt water gargling, chloraseptic spray and throat lozenges - Mucinex.  - Humidifying the air.        I discussed the assessment  and treatment plan with the patient. The patient was provided an opportunity to ask questions and all were answered. The patient agreed with the plan and demonstrated an understanding of the instructions.   The patient was advised to call back or seek an in-person evaluation if the symptoms worsen or if the condition fails to improve as anticipated.   I provided 21+ minutes of time during this encounter.    Follow up plan: Return if symptoms worsen or fail to improve.

## 2022-02-05 NOTE — Patient Instructions (Incomplete)
Please call to schedule your mammogram and/or bone density: [facility] [place]: [address], [place], [place] [address] Phone: [phone]  [place] [facility] [address] [place],  [place]  [address] Phone: [phone]    DASH Eating Plan DASH stands for Dietary Approaches to Stop Hypertension. The DASH eating plan is a healthy eating plan that has been shown to: Reduce high blood pressure (hypertension). Reduce your risk for type 2 diabetes, heart disease, and stroke. Help with weight loss. What are tips for following this plan? Reading food labels Check food labels for the amount of salt (sodium) per serving. Choose foods with less than 5 percent of the Daily Value of sodium. Generally, foods with less than 300 milligrams (mg) of sodium per serving fit into this eating plan. To find whole grains, look for the word "whole" as the first word in the ingredient list. Shopping Buy products labeled as "low-sodium" or "no salt added." Buy fresh foods. Avoid canned foods and pre-made or frozen meals. Cooking Avoid adding salt when cooking. Use salt-free seasonings or herbs instead of table salt or sea salt. Check with your health care provider or pharmacist before using salt substitutes. Do not fry foods. Cook foods using healthy methods such as baking, boiling, grilling, roasting, and broiling instead. Cook with heart-healthy oils, such as olive, canola, avocado, soybean, or sunflower oil. Meal planning  Eat a balanced diet that includes: 4 or more servings of fruits and 4 or more servings of vegetables each day. Try to fill one-half of your plate with fruits and vegetables. 6-8 servings of whole grains each day. Less than 6 oz (170 g) of lean meat, poultry, or fish each day. A 3-oz (85-g) serving of meat is about the same size as a deck of cards. One egg equals 1 oz (28 g). 2-3 servings of low-fat dairy each  day. One serving is 1 cup (237 mL). 1 serving of nuts, seeds, or beans 5 times each week. 2-3 servings of heart-healthy fats. Healthy fats called omega-3 fatty acids are found in foods such as walnuts, flaxseeds, fortified milks, and eggs. These fats are also found in cold-water fish, such as sardines, salmon, and mackerel. Limit how much you eat of: Canned or prepackaged foods. Food that is high in trans fat, such as some fried foods. Food that is high in saturated fat, such as fatty meat. Desserts and other sweets, sugary drinks, and other foods with added sugar. Full-fat dairy products. Do not salt foods before eating. Do not eat more than 4 egg yolks a week. Try to eat at least 2 vegetarian meals a week. Eat more home-cooked food and less restaurant, buffet, and fast food. Lifestyle When eating at a restaurant, ask that your food be prepared with less salt or no salt, if possible. If you drink alcohol: Limit how much you use to: 0-1 drink a day for women who are not pregnant. 0-2 drinks a day for men. Be aware of how much alcohol is in your drink. In the [place] one drink equals one 12 oz bottle of beer (355 mL), one 5 oz glass of wine (148 mL), or one 1 oz glass of hard liquor (44 mL). General information Avoid eating more than 2,300 mg of salt a day. If you have hypertension, you may need to reduce your sodium intake to 1,500 mg a day. Work with your health care provider to maintain a healthy  body weight or to lose weight. Ask what an ideal weight is for you. Get at least 30 minutes of exercise that causes your heart to beat faster (aerobic exercise) most days of the week. Activities may include walking, swimming, or biking. Work with your health care provider or dietitian to adjust your eating plan to your individual calorie needs. What foods should I eat? Fruits All fresh, dried, or frozen fruit. Canned fruit in natural juice (without added sugar). Vegetables Fresh or frozen  vegetables (raw, steamed, roasted, or grilled). Low-sodium or reduced-sodium tomato and vegetable juice. Low-sodium or reduced-sodium tomato sauce and tomato paste. Low-sodium or reduced-sodium canned vegetables. Grains Whole-grain or whole-wheat bread. Whole-grain or whole-wheat pasta. Brown rice. [place]. Bulgur. Whole-grain and low-sodium cereals. Pita bread. Low-fat, low-sodium crackers. Whole-wheat flour tortillas. Meats and other proteins Skinless chicken or [place]. Ground chicken or [place]. Pork with fat trimmed off. Fish and seafood. Egg whites. Dried beans, peas, or lentils. Unsalted nuts, nut butters, and seeds. Unsalted canned beans. Lean cuts of beef with fat trimmed off. Low-sodium, lean precooked or cured meat, such as sausages or meat loaves. Dairy Low-fat (1%) or fat-free (skim) milk. Reduced-fat, low-fat, or fat-free cheeses. Nonfat, low-sodium ricotta or cottage cheese. Low-fat or nonfat yogurt. Low-fat, low-sodium cheese. Fats and oils Soft margarine without trans fats. Vegetable oil. Reduced-fat, low-fat, or light mayonnaise and salad dressings (reduced-sodium). Canola, safflower, olive, avocado, soybean, and sunflower oils. Avocado. Seasonings and condiments Herbs. Spices. Seasoning mixes without salt. Other foods Unsalted popcorn and pretzels. Fat-free sweets. The items listed above may not be a complete list of foods and beverages you can eat. Contact a dietitian for more information. What foods should I avoid? Fruits Canned fruit in a light or heavy syrup. Fried fruit. Fruit in cream or butter sauce. Vegetables Creamed or fried vegetables. Vegetables in a cheese sauce. Regular canned vegetables (not low-sodium or reduced-sodium). Regular canned tomato sauce and paste (not low-sodium or reduced-sodium). Regular tomato and vegetable juice (not low-sodium or reduced-sodium). Angie Fava. Olives. Grains Baked goods made with fat, such as croissants, muffins, or some  breads. Dry pasta or rice meal packs. Meats and other proteins Fatty cuts of meat. Ribs. Fried meat. Berniece Salines. Bologna, salami, and other precooked or cured meats, such as sausages or meat loaves. Fat from the back of a pig (fatback). Bratwurst. Salted nuts and seeds. Canned beans with added salt. Canned or smoked fish. Whole eggs or egg yolks. Chicken or [place] with skin. Dairy Whole or 2% milk, cream, and half-and-half. Whole or full-fat cream cheese. Whole-fat or sweetened yogurt. Full-fat cheese. Nondairy creamers. Whipped toppings. Processed cheese and cheese spreads. Fats and oils Butter. Stick margarine. Lard. Shortening. Ghee. Bacon fat. Tropical oils, such as coconut, palm kernel, or palm oil. Seasonings and condiments Onion salt, garlic salt, seasoned salt, table salt, and sea salt. Worcestershire sauce. Tartar sauce. Barbecue sauce. Teriyaki sauce. Soy sauce, including reduced-sodium. Steak sauce. Canned and packaged gravies. Fish sauce. Oyster sauce. Cocktail sauce. Store-bought horseradish. Ketchup. Mustard. Meat flavorings and tenderizers. Bouillon cubes. Hot sauces. Pre-made or packaged marinades. Pre-made or packaged taco seasonings. Relishes. Regular salad dressings. Other foods Salted popcorn and pretzels. The items listed above may not be a complete list of foods and beverages you should avoid. Contact a dietitian for more information. Where to find more information National Heart, Lung, and Blood Institute: https://wilson-eaton.com/ American Heart Association: www.heart.org Academy of Nutrition and Dietetics: www.eatright.[place]: www.kidney.org Summary The DASH eating plan is a healthy eating  plan that has been shown to reduce high blood pressure (hypertension). It may also reduce your risk for type 2 diabetes, heart disease, and stroke. When on the DASH eating plan, aim to eat more fresh fruits and vegetables, whole grains, lean proteins, low-fat dairy, and  heart-healthy fats. With the DASH eating plan, you should limit salt (sodium) intake to 2,300 mg a day. If you have hypertension, you may need to reduce your sodium intake to 1,500 mg a day. Work with your health care provider or dietitian to adjust your eating plan to your individual calorie needs. This information is not intended to replace advice given to you by your health care provider. Make sure you discuss any questions you have with your health care provider. Document Revised: 11/29/2018 Document Reviewed: 11/29/2018 Elsevier Patient Education  [place].

## 2022-02-08 ENCOUNTER — Ambulatory Visit: Payer: 59 | Admitting: Nurse Practitioner

## 2022-02-08 DIAGNOSIS — E7800 Pure hypercholesterolemia, unspecified: Secondary | ICD-10-CM

## 2022-02-08 DIAGNOSIS — D649 Anemia, unspecified: Secondary | ICD-10-CM

## 2022-02-08 DIAGNOSIS — Z114 Encounter for screening for human immunodeficiency virus [HIV]: Secondary | ICD-10-CM

## 2022-02-08 DIAGNOSIS — I10 Essential (primary) hypertension: Secondary | ICD-10-CM

## 2022-02-08 DIAGNOSIS — E559 Vitamin D deficiency, unspecified: Secondary | ICD-10-CM

## 2022-02-08 DIAGNOSIS — Z86718 Personal history of other venous thrombosis and embolism: Secondary | ICD-10-CM

## 2022-02-08 DIAGNOSIS — Z1231 Encounter for screening mammogram for malignant neoplasm of breast: Secondary | ICD-10-CM

## 2022-02-08 DIAGNOSIS — R7301 Impaired fasting glucose: Secondary | ICD-10-CM

## 2022-02-08 DIAGNOSIS — Z1159 Encounter for screening for other viral diseases: Secondary | ICD-10-CM

## 2022-04-27 ENCOUNTER — Other Ambulatory Visit: Payer: Self-pay | Admitting: Nurse Practitioner

## 2022-04-28 NOTE — Telephone Encounter (Signed)
Requested Prescriptions  Pending Prescriptions Disp Refills   cyclobenzaprine (FLEXERIL) 10 MG tablet [Pharmacy Med Name: CYCLOBENZAPRINE 10 MG TABLET] 90 tablet 0    Sig: TAKE 1 TABLET BY MOUTH THREE TIMES A DAY AS NEEDED FOR MUSCLE SPASM     Not Delegated - Analgesics:  Muscle Relaxants Failed - 04/27/2022 11:01 AM      Failed - This refill cannot be delegated      Passed - Valid encounter within last 6 months    Recent Outpatient Visits           3 months ago Lab test positive for detection of COVID-19 virus   [place] [facility] Eugenio Saenz, Whitney T, NP   1 year ago Essential hypertension   [place] [facility] [place], [place] T, NP   1 year ago Encounter to establish care   [place] [facility] [place], [place] T, NP               lisinopril (ZESTRIL) 20 MG tablet [Pharmacy Med Name: LISINOPRIL 20 MG TABLET] 90 tablet 0    Sig: TAKE 1 TABLET BY MOUTH EVERY DAY     Cardiovascular:  ACE Inhibitors Failed - 04/27/2022 11:01 AM      Failed - Last BP in normal range    BP Readings from Last 1 Encounters:  10/30/21 (!) 134/98         Passed - Cr in normal range and within 180 days    Creatinine  Date Value Ref Range Status  12/13/2011 0.70 0.60 - 1.30 mg/dL Final   Creatinine, Ser  Date Value Ref Range Status  10/30/2021 0.63 0.44 - 1.00 mg/dL Final         Passed - K in normal range and within 180 days    Potassium  Date Value Ref Range Status  10/30/2021 3.8 3.5 - 5.1 mmol/L Final  12/13/2011 3.8 3.5 - 5.1 mmol/L Final         Passed - Patient is not pregnant      Passed - Valid encounter within last 6 months    Recent Outpatient Visits           3 months ago Lab test positive for detection of COVID-19 virus   [place] [facility] [place], Corrie Dandy T, NP   1 year ago Essential hypertension    [facility] [place], Corrie Dandy T, NP   1 year ago Encounter to establish care   [facility] [place], Dorie Rank, NP

## 2022-04-28 NOTE — Telephone Encounter (Signed)
Requested medication (s) are due for refill today: yes  Requested medication (s) are on the active medication list: yes  Last refill:  11/30/21  Future visit scheduled: yes  Notes to clinic:  Unable to refill per protocol, cannot delegate.      Requested Prescriptions  Pending Prescriptions Disp Refills   cyclobenzaprine (FLEXERIL) 10 MG tablet [Pharmacy Med Name: CYCLOBENZAPRINE 10 MG TABLET] 90 tablet 0    Sig: TAKE 1 TABLET BY MOUTH THREE TIMES A DAY AS NEEDED FOR MUSCLE SPASM     Not Delegated - Analgesics:  Muscle Relaxants Failed - 04/27/2022 11:01 AM      Failed - This refill cannot be delegated      Passed - Valid encounter within last 6 months    Recent Outpatient Visits           3 months ago Lab test positive for detection of COVID-19 virus   [place] [facility] [place], [place] T, NP   1 year ago Essential hypertension   [place] [facility] [place], [place] T, NP   1 year ago Encounter to establish care   [place] [facility] [place], Corrie Dandy T, NP              Signed Prescriptions Disp Refills   lisinopril (ZESTRIL) 20 MG tablet 90 tablet 0    Sig: TAKE 1 TABLET BY MOUTH EVERY DAY     Cardiovascular:  ACE Inhibitors Failed - 04/27/2022 11:01 AM      Failed - Last BP in normal range    BP Readings from Last 1 Encounters:  10/30/21 (!) 134/98         Passed - Cr in normal range and within 180 days    Creatinine  Date Value Ref Range Status  12/13/2011 0.70 0.60 - 1.30 mg/dL Final   Creatinine, Ser  Date Value Ref Range Status  10/30/2021 0.63 0.44 - 1.00 mg/dL Final         Passed - K in normal range and within 180 days    Potassium  Date Value Ref Range Status  10/30/2021 3.8 3.5 - 5.1 mmol/L Final  12/13/2011 3.8 3.5 - 5.1 mmol/L Final         Passed - Patient is not pregnant      Passed - Valid encounter within last 6 months    Recent Outpatient Visits           3 months ago Lab test  positive for detection of COVID-19 virus   [place] [facility] [place], Corrie Dandy T, NP   1 year ago Essential hypertension   [place] [facility] [place], Corrie Dandy T, NP   1 year ago Encounter to establish care   [place] [facility] [place], Dorie Rank, NP

## 2022-05-25 ENCOUNTER — Ambulatory Visit: Payer: 59 | Admitting: Nurse Practitioner

## 2022-07-25 ENCOUNTER — Other Ambulatory Visit: Payer: Self-pay | Admitting: Nurse Practitioner

## 2022-07-26 NOTE — Telephone Encounter (Signed)
Requested medication (s) are due for refill today: yes  Requested medication (s) are on the active medication list: yes  Last refill:  cyclobenzaprine: 04/28/22 #90       Benzonatate: 01/26/22 #45  Future visit scheduled: no   Notes to clinic:  last OV 1 year ago- called pt and LM on VM to call back to schedule appt./Flexeril: not delegated to NT to RF   Requested Prescriptions  Pending Prescriptions Disp Refills   cyclobenzaprine (FLEXERIL) 10 MG tablet [Pharmacy Med Name: CYCLOBENZAPRINE 10 MG TABLET] 90 tablet 0    Sig: TAKE 1 TABLET BY MOUTH THREE TIMES A DAY AS NEEDED FOR MUSCLE SPASM     Not Delegated - Analgesics:  Muscle Relaxants Failed - 07/25/2022  1:59 PM      Failed - This refill cannot be delegated      Passed - Valid encounter within last 6 months    Recent Outpatient Visits           6 months ago Lab test positive for detection of COVID-19 virus   [place] [facility] [place], Corrie Dandy T, NP   1 year ago Essential hypertension   [place] [facility] [place], [place] T, NP   2 years ago Encounter to establish care   [place] [facility] [place], DISH T, NP               benzonatate (TESSALON) 100 MG capsule [Pharmacy Med Name: BENZONATATE 100 MG CAPSULE] 45 capsule 0    Sig: TAKE 1 CAPSULE BY MOUTH THREE TIMES A DAY AS NEEDED     Ear, Nose, and Throat:  Antitussives/Expectorants Passed - 07/25/2022  1:59 PM      Passed - Valid encounter within last 12 months    Recent Outpatient Visits           6 months ago Lab test positive for detection of COVID-19 virus   [place] [facility] [place], [place] T, NP   1 year ago Essential hypertension   [place] [facility] [place], [place] T, NP   2 years ago Encounter to establish care   [place] [facility] [place], [place] T, NP               lisinopril (ZESTRIL) 20 MG tablet [Pharmacy Med Name: LISINOPRIL 20 MG  TABLET] 30 tablet 2    Sig: TAKE 1 TABLET BY MOUTH EVERY DAY     Cardiovascular:  ACE Inhibitors Failed - 07/25/2022  1:59 PM      Failed - Cr in normal range and within 180 days    Creatinine  Date Value Ref Range Status  12/13/2011 0.70 0.60 - 1.30 mg/dL Final   Creatinine, Ser  Date Value Ref Range Status  10/30/2021 0.63 0.44 - 1.00 mg/dL Final         Failed - K in normal range and within 180 days    Potassium  Date Value Ref Range Status  10/30/2021 3.8 3.5 - 5.1 mmol/L Final  12/13/2011 3.8 3.5 - 5.1 mmol/L Final         Failed - Last BP in normal range    BP Readings from Last 1 Encounters:  10/30/21 (!) 134/98         Passed - Patient is not pregnant      Passed - Valid encounter within last 6 months    Recent Outpatient Visits  6 months ago Lab test positive for detection of COVID-19 virus   [place] [facility] [place], Corrie Dandy T, NP   1 year ago Essential hypertension   [place] [facility] [place], Corrie Dandy T, NP   2 years ago Encounter to establish care    [facility] [place], Dorie Rank, NP

## 2022-07-27 ENCOUNTER — Other Ambulatory Visit: Payer: Self-pay | Admitting: Nurse Practitioner

## 2022-07-27 NOTE — Telephone Encounter (Signed)
Medication Refill - Medication: benzonatate (TESSALON PERLES) 100 MG capsule    lisinopril (ZESTRIL) 20 MG tablet cyclobenzaprine (FLEXERIL) 10 MG tablet [[id number]]   Patient is out of BP meds and would like call back today.    Has the patient contacted their pharmacy? Yes.     Preferred Pharmacy (with phone number or street name):   [id number] - [place], [place] - [address] [place] [address] Phone: [phone]  Fax: [phone]     Has the patient been seen for an appointment in the last year OR does the patient have an upcoming appointment? Yes.    Agent: Please be advised that RX refills may take up to 3 business days. We ask that you follow-up with your pharmacy.

## 2022-07-28 ENCOUNTER — Other Ambulatory Visit: Payer: Self-pay | Admitting: Nurse Practitioner

## 2022-07-28 ENCOUNTER — Telehealth: Payer: Self-pay | Admitting: Nurse Practitioner

## 2022-07-28 MED ORDER — LISINOPRIL 20 MG PO TABS: 20.0000 mg | ORAL_TABLET | Freq: Every day | ORAL | 0 refills | Status: DC

## 2022-07-28 MED ORDER — BENZONATATE 100 MG PO CAPS: 100.0000 mg | ORAL_CAPSULE | Freq: Three times a day (TID) | ORAL | 0 refills | Status: DC | PRN

## 2022-07-28 MED ORDER — CYCLOBENZAPRINE HCL 10 MG PO TABS: ORAL_TABLET | ORAL | 0 refills | Status: DC

## 2022-07-28 NOTE — Telephone Encounter (Signed)
Requested medications are due for refill today.  yes  Requested medications are on the active medications list.  yes  Last refill. 04/28/2022 #90 0 rf  Future visit scheduled.   no  Notes to clinic.  Refill not delegated.    Requested Prescriptions  Pending Prescriptions Disp Refills   cyclobenzaprine (FLEXERIL) 10 MG tablet 90 tablet 0    Sig: TAKE 1 TABLET BY MOUTH THREE TIMES A DAY AS NEEDED FOR MUSCLE SPASM     Not Delegated - Analgesics:  Muscle Relaxants Failed - 07/27/2022  5:53 PM      Failed - This refill cannot be delegated      Failed - Valid encounter within last 6 months    Recent Outpatient Visits           6 months ago Lab test positive for detection of COVID-19 virus   [place] [facility] [place], Corrie Dandy T, NP   1 year ago Essential hypertension   [place] [facility] [place], [place] T, NP   2 years ago Encounter to establish care   [place] [facility] [place], Corrie Dandy T, NP              Signed Prescriptions Disp Refills   benzonatate (TESSALON PERLES) 100 MG capsule 45 capsule 0    Sig: Take 1 capsule (100 mg total) by mouth 3 (three) times daily as needed.     Ear, Nose, and Throat:  Antitussives/Expectorants Passed - 07/27/2022  5:53 PM      Passed - Valid encounter within last 12 months    Recent Outpatient Visits           6 months ago Lab test positive for detection of COVID-19 virus   [place] [facility] [place], Corrie Dandy T, NP   1 year ago Essential hypertension   [place] [facility] [place], [place] T, NP   2 years ago Encounter to establish care   [place] [facility] [place], [place] T, NP               lisinopril (ZESTRIL) 20 MG tablet 30 tablet 0    Sig: Take 1 tablet (20 mg total) by mouth daily.     Cardiovascular:  ACE Inhibitors Failed - 07/27/2022  5:53 PM      Failed - Cr in normal range and within 180 days    Creatinine   Date Value Ref Range Status  12/13/2011 0.70 0.60 - 1.30 mg/dL Final   Creatinine, Ser  Date Value Ref Range Status  10/30/2021 0.63 0.44 - 1.00 mg/dL Final         Failed - K in normal range and within 180 days    Potassium  Date Value Ref Range Status  10/30/2021 3.8 3.5 - 5.1 mmol/L Final  12/13/2011 3.8 3.5 - 5.1 mmol/L Final         Failed - Last BP in normal range    BP Readings from Last 1 Encounters:  10/30/21 (!) 134/98         Failed - Valid encounter within last 6 months    Recent Outpatient Visits           6 months ago Lab test positive for detection of COVID-19 virus   [facility] [facility] [place], Corrie Dandy T, NP   1 year ago Essential hypertension    [facility] [place], Corrie Dandy T, NP   2 years ago Encounter  to establish care   [place] [facility] [place], Dorie Rank, NP              Passed - Patient is not pregnant

## 2022-07-28 NOTE — Telephone Encounter (Signed)
Courtesy refill. Patient will need an office visit for further refills. Requested Prescriptions  Pending Prescriptions Disp Refills   benzonatate (TESSALON PERLES) 100 MG capsule 45 capsule 0    Sig: Take 1 capsule (100 mg total) by mouth 3 (three) times daily as needed.     Ear, Nose, and Throat:  Antitussives/Expectorants Passed - 07/27/2022  5:53 PM      Passed - Valid encounter within last 12 months    Recent Outpatient Visits           6 months ago Lab test positive for detection of COVID-19 virus   [place] [facility] [place], Corrie Dandy T, NP   1 year ago Essential hypertension   [place] [facility] [place], Corrie Dandy T, NP   2 years ago Encounter to establish care   [place] [facility] [place], [place] T, NP               cyclobenzaprine (FLEXERIL) 10 MG tablet 90 tablet 0    Sig: TAKE 1 TABLET BY MOUTH THREE TIMES A DAY AS NEEDED FOR MUSCLE SPASM     Not Delegated - Analgesics:  Muscle Relaxants Failed - 07/27/2022  5:53 PM      Failed - This refill cannot be delegated      Failed - Valid encounter within last 6 months    Recent Outpatient Visits           6 months ago Lab test positive for detection of COVID-19 virus   [place] [facility] [place], Corrie Dandy T, NP   1 year ago Essential hypertension   [place] [facility] [place], [place] T, NP   2 years ago Encounter to establish care   [place] [facility] [place], Kenneth T, NP               lisinopril (ZESTRIL) 20 MG tablet 30 tablet 0    Sig: Take 1 tablet (20 mg total) by mouth daily.     Cardiovascular:  ACE Inhibitors Failed - 07/27/2022  5:53 PM      Failed - Cr in normal range and within 180 days    Creatinine  Date Value Ref Range Status  12/13/2011 0.70 0.60 - 1.30 mg/dL Final   Creatinine, Ser  Date Value Ref Range Status  10/30/2021 0.63 0.44 - 1.00 mg/dL Final         Failed - K in normal range  and within 180 days    Potassium  Date Value Ref Range Status  10/30/2021 3.8 3.5 - 5.1 mmol/L Final  12/13/2011 3.8 3.5 - 5.1 mmol/L Final         Failed - Last BP in normal range    BP Readings from Last 1 Encounters:  10/30/21 (!) 134/98         Failed - Valid encounter within last 6 months    Recent Outpatient Visits           6 months ago Lab test positive for detection of COVID-19 virus   [place] [facility] [place], Corrie Dandy T, NP   1 year ago Essential hypertension   [place] [facility] [place], Corrie Dandy T, NP   2 years ago Encounter to establish care   [place] [facility] [place], Dorie Rank, NP              Passed - Patient is not pregnant

## 2022-07-28 NOTE — Telephone Encounter (Signed)
Patient called stated she has called about her medication and now she no longer has any lisinopril (ZESTRIL) 20 MG tablet, cyclobenzaprine (FLEXERIL) 10 MG tablet and guaiFENesin-codeine 100-10 MG/5ML syrup all as of yesterday. Please f/u with patient

## 2022-07-28 NOTE — Telephone Encounter (Signed)
Called pt  - left message on machine to return call and schedule appt. 

## 2022-07-30 NOTE — Patient Instructions (Signed)

## 2022-08-02 ENCOUNTER — Encounter: Payer: Self-pay | Admitting: Nurse Practitioner

## 2022-08-02 ENCOUNTER — Ambulatory Visit (INDEPENDENT_AMBULATORY_CARE_PROVIDER_SITE_OTHER): Payer: 59 | Admitting: Nurse Practitioner

## 2022-08-02 VITALS — BP 127/77 | HR 70 | Temp 97.7°F | Ht 67.0 in | Wt 181.4 lb

## 2022-08-02 DIAGNOSIS — E7800 Pure hypercholesterolemia, unspecified: Secondary | ICD-10-CM

## 2022-08-02 DIAGNOSIS — I10 Essential (primary) hypertension: Secondary | ICD-10-CM

## 2022-08-02 DIAGNOSIS — Z1231 Encounter for screening mammogram for malignant neoplasm of breast: Secondary | ICD-10-CM

## 2022-08-02 DIAGNOSIS — Z114 Encounter for screening for human immunodeficiency virus [HIV]: Secondary | ICD-10-CM

## 2022-08-02 DIAGNOSIS — Z1211 Encounter for screening for malignant neoplasm of colon: Secondary | ICD-10-CM | POA: Diagnosis not present

## 2022-08-02 DIAGNOSIS — Z6828 Body mass index (BMI) 28.0-28.9, adult: Secondary | ICD-10-CM | POA: Insufficient documentation

## 2022-08-02 DIAGNOSIS — M25572 Pain in left ankle and joints of left foot: Secondary | ICD-10-CM | POA: Insufficient documentation

## 2022-08-02 DIAGNOSIS — R7301 Impaired fasting glucose: Secondary | ICD-10-CM

## 2022-08-02 DIAGNOSIS — Z1159 Encounter for screening for other viral diseases: Secondary | ICD-10-CM

## 2022-08-02 DIAGNOSIS — Z86718 Personal history of other venous thrombosis and embolism: Secondary | ICD-10-CM

## 2022-08-02 LAB — MICROALBUMIN, URINE WAIVED
Creatinine, Urine Waived: 200 mg/dL (ref 10–300)
Microalb, Ur Waived: 30 mg/L — ABNORMAL HIGH (ref 0–19)
Microalb/Creat Ratio: <30 mg/g (ref <30)

## 2022-08-02 MED ORDER — MELOXICAM 15 MG PO TABS: 15.0000 mg | ORAL_TABLET | Freq: Every day | ORAL | 0 refills | Status: DC

## 2022-08-02 MED ORDER — METFORMIN HCL 500 MG PO TABS: 500.0000 mg | ORAL_TABLET | Freq: Two times a day (BID) | ORAL | 3 refills | Status: DC

## 2022-08-02 MED ORDER — CYCLOBENZAPRINE HCL 10 MG PO TABS: ORAL_TABLET | ORAL | 2 refills | Status: AC

## 2022-08-02 MED ORDER — LISINOPRIL 20 MG PO TABS: 20.0000 mg | ORAL_TABLET | Freq: Every day | ORAL | 2 refills | Status: DC

## 2022-08-02 NOTE — Assessment & Plan Note (Signed)
For 3 weeks, positive squeeze test and moderate antalgic gait which is affecting her hip.  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 Will send in Meloxicam 15 MG to take daily as needed + continue Flexeril.  Recommend she ice ankle frequently and elevate.  Discussed need to wear compression sleeve to ankle for support.

## 2022-08-02 NOTE — Assessment & Plan Note (Signed)
Chronic, stable.  BP at goal in office and at home.  Recommend she monitor BP at least a few mornings a week at home and document.  DASH diet at home.  Continue current medication regimen and adjust as needed, refills sent.  LABS: CBC, CMP, TSH, urine ALB.  Urine ALB 08 August 2022, continue ACE for kidney protection.  Return in 6 months.

## 2022-08-02 NOTE — Assessment & Plan Note (Signed)
Noted on past labs with glucose 114 to 120, but last A1c 5.6%.  Check A1c today, patient in agreeance with this.  Will start Metformin to assist with weight loss, would like to lose 30 pounds.  Educated her on this medication and side effects.

## 2022-08-02 NOTE — Progress Notes (Signed)
BP 127/77   Pulse 70   Temp 97.7 F (36.5 C) (Oral)   Ht 5\' 7"  (1.702 m)   Wt 181 lb 6.4 oz (82.3 kg)   LMP 08/24/2018   SpO2 97%   BMI 28.41 kg/m    Subjective:    Patient ID: Christine Chase, female    DOB: 14-Dec-1965, 57 y.o.   MRN: [id number]  HPI: Christine Chase is a 57 y.o. female  Chief Complaint  Patient presents with   Hyperlipidemia   Hypertension   IFG   Weight Loss    Pt states she would like to discuss weight loss options   Ankle Pain    Pt states she has been having pain in the back side of her L ankle in the achilles area. States she has been having swelling in that area and pain in her foot as well.    HYPERTENSION Is still taking Lisinopril 20 MG daily.  Has had no ADR with this.  Has history of elevation on glucose on labs, last A1c in 2022 was 5.6%. Hypertension status: stable  Satisfied with current treatment? yes Duration of hypertension: chronic BP monitoring frequency: twice a week BP range: 120/70 range BP medication side effects:  no Medication compliance: good compliance Previous BP meds: as above Aspirin: no Recurrent headaches: no Visual changes: no Palpitations: no Dyspnea: no Chest pain: no Lower extremity edema: no Dizzy/lightheaded: no  The 10-year ASCVD risk score (Arnett DK, et al., 2019) is: 4.2%   Values used to calculate the score:     Age: 72 years     Sex: Female     Is Non-Hispanic African American: No     Diabetic: No     Tobacco smoker: No     Systolic Blood Pressure: 127 mmHg     Is BP treated: Yes     HDL Cholesterol: 48 mg/dL     Total Cholesterol: 259 mg/dL  WEIGHT GAIN Is interested in weight loss.  Spoke to insurance and they reported she needs to try 3 different medications prior to [facility] use.  Has been working on diet and exercise -- gave up soda. Duration: long term Previous attempts at weight loss: yes Complications of obesity: HTN Peak weight: 200 lbs Weight loss goal: 150 lbs Weight loss to  date: none Requesting obesity pharmacotherapy: yes Current weight loss supplements/medications: none Previous weight loss supplements/meds: yes, Phentermine Calories:  1800  ANKLE PAIN Pain to left ankle, has been present for 3 weeks as was going to gym for weight loss routine.  Was on treadmill and went to 10 incline at 3.6 speed, she thinks this may have caused pain. Duration: weeks Involved foot: left Mechanism of injury:  as above Location: left ankle, Achilles area Onset: sudden  Severity: 10/10  Quality:  sharp, aching, and throbbing Frequency: intermittent Radiation: no Aggravating factors: weight bearing, walking, and movement + lyingdown Alleviating factors:  Flexeril and rest  Status: fluctuating Treatments attempted:  Flexeril and rest  Relief with NSAIDs?:  No NSAIDs Taken Weakness with weight bearing or walking: no Morning stiffness: yes Swelling: yes Redness: no Bruising: no Paresthesias / decreased sensation: no  Fevers:no   Relevant past medical, surgical, family and social history reviewed and updated as indicated. Interim medical history since our last visit reviewed. Allergies and medications reviewed and updated.  Review of Systems  Constitutional:  Negative for activity change, appetite change, diaphoresis, fatigue and fever.  Respiratory:  Negative for cough, chest tightness, shortness of  breath and wheezing.   Cardiovascular:  Negative for chest pain, palpitations and leg swelling.  Gastrointestinal: Negative.   Endocrine: Negative for polydipsia, polyphagia and polyuria.  Musculoskeletal:  Positive for arthralgias.  Neurological: Negative.   Psychiatric/Behavioral: Negative.      Per HPI unless specifically indicated above     Objective:    BP 127/77   Pulse 70   Temp 97.7 F (36.5 C) (Oral)   Ht 5\' 7"  (1.702 m)   Wt 181 lb 6.4 oz (82.3 kg)   LMP 08/24/2018   SpO2 97%   BMI 28.41 kg/m   Wt Readings from Last 3 Encounters:  08/02/22  181 lb 6.4 oz (82.3 kg)  10/30/21 183 lb (83 kg)  01/26/21 175 lb (79.4 kg)    Physical Exam Vitals and nursing note reviewed.  Constitutional:      General: She is awake. She is not in acute distress.    Appearance: She is well-developed, well-groomed and overweight. She is not ill-appearing or toxic-appearing.  HENT:     Head: Normocephalic.     Right Ear: Hearing and external ear normal.     Left Ear: Hearing and external ear normal.  Eyes:     General: Lids are normal.        Right eye: No discharge.        Left eye: No discharge.     Conjunctiva/sclera: Conjunctivae normal.     Pupils: Pupils are equal, round, and reactive to light.  Neck:     Thyroid: No thyromegaly.     Vascular: No carotid bruit.  Cardiovascular:     Rate and Rhythm: Normal rate and regular rhythm.     Heart sounds: Normal heart sounds. No murmur heard.    No gallop.  Pulmonary:     Effort: Pulmonary effort is normal. No accessory muscle usage or respiratory distress.     Breath sounds: Normal breath sounds.  Abdominal:     General: Bowel sounds are normal. There is no distension.     Palpations: Abdomen is soft.     Tenderness: There is no abdominal tenderness.  Musculoskeletal:     Cervical back: Normal range of motion and neck supple.     Right lower leg: No edema.     Left lower leg: No edema.  Lymphadenopathy:     Cervical: No cervical adenopathy.  Skin:    General: Skin is warm and dry.  Neurological:     Mental Status: She is alert and oriented to person, place, and time.     Deep Tendon Reflexes: Reflexes are normal and symmetric.     Reflex Scores:      Brachioradialis reflexes are 2+ on the right side and 2+ on the left side.      Patellar reflexes are 2+ on the right side and 2+ on the left side. Psychiatric:        Attention and Perception: Attention normal.        Mood and Affect: Mood normal.        Speech: Speech normal.        Behavior: Behavior normal. Behavior is  cooperative.        Thought Content: Thought content normal.    Ankle Exam: Left    Inspection: Ankle inspection abnormal     Tenderness to Palpation:   yes  Achille's area     Range of Motion: Full ROM    Dorsiflexion: Normal    Plantar flexion: Normal  Strength:  Abnormal    Dorsiflexion:  3/5    Plantar flexion:  3/5     Special Tests:    Anterior drawer: negative    Talar tilt: negative    Syndesmosis squeeze test: positive     Gait:  moderate antalgic;  Results for orders placed or performed in visit on 08/02/22  Microalbumin, Urine Waived  Result Value Ref Range   Microalb, Ur Waived 30 (H) 0 - 19 mg/L   Creatinine, Urine Waived 200 10 - 300 mg/dL   Microalb/Creat Ratio <30 <30 mg/g      Assessment & Plan:   Problem List Items Addressed This Visit       Cardiovascular and Mediastinum   Essential hypertension - Primary    Chronic, stable.  BP at goal in office and at home.  Recommend she monitor BP at least a few mornings a week at home and document.  DASH diet at home.  Continue current medication regimen and adjust as needed, refills sent.  LABS: CBC, CMP, TSH, urine ALB.  Urine ALB 08 August 2022, continue ACE for kidney protection.  Return in 6 months.        Relevant Medications   lisinopril (ZESTRIL) 20 MG tablet   Other Relevant Orders   CBC with Differential/Platelet   Comprehensive metabolic panel   TSH   Microalbumin, Urine Waived (Completed)     Endocrine   IFG (impaired fasting glucose)    Noted on past labs with glucose 114 to 120, but last A1c 5.6%.  Check A1c today, patient in agreeance with this.  Will start Metformin to assist with weight loss, would like to lose 30 pounds.  Educated her on this medication and side effects.      Relevant Orders   Microalbumin, Urine Waived (Completed)   HgB A1c     Other   Acute left ankle pain    For 3 weeks, positive squeeze test and moderate antalgic gait which is affecting her hip.  Concern for  rupture of Achille's. Will send in Meloxicam 15 MG to take daily as needed + continue Flexeril.  Recommend she ice ankle frequently and elevate.  Discussed need to wear compression sleeve to ankle for support.      Relevant Orders   Ambulatory referral to Podiatry   BMI 28.0-28.9,adult    BMI 28.41.  She would like to lose 30 pounds.  Will send in Metformin 500 MG BID due to IFG in past and weight loss desire. Educated her on this and side effects.  Could also consider Contrave or Topamax in future. Her insurance told her she needs to try 3 other medications and fail before she can get injectables.  Recommended eating smaller high protein, low fat meals more frequently and exercising 30 mins a day 5 times a week with a goal of 10-15lb weight loss in the next 3 months. Patient voiced their understanding and motivation to adhere to these recommendations.       Elevated low density lipoprotein (LDL) cholesterol level    Noted on past labs.  Check Lipid panel today.  ASCVD 4.2%.  Continue diet and exercise focus, initiate medication as needed.      Relevant Orders   Comprehensive metabolic panel   Lipid Panel w/o Chol/HDL Ratio   Other Visit Diagnoses     Encounter for screening for HIV       HIV screen on labs today per guidelines for one time screening, discussed with patient.  Relevant Orders   HIV Antibody (routine testing w rflx)   Need for hepatitis C screening test       Hep C screen on labs today per guidelines for one time screening, discussed with patient.   Relevant Orders   Hepatitis C antibody   Encounter for screening mammogram for malignant neoplasm of breast       Mammogram ordered and instructed on how to obtain   Relevant Orders   MM 3D SCREENING MAMMOGRAM BILATERAL BREAST   Screening for colon cancer       Cologuard ordered   Relevant Orders   Cologuard        Follow up plan: Return in about 6 months (around 02/02/2023) for WEIGHT LOSS AND HTN.

## 2022-08-02 NOTE — Assessment & Plan Note (Signed)
Noted on past labs.  Check Lipid panel today.  ASCVD 4.2%.  Continue diet and exercise focus, initiate medication as needed.

## 2022-08-02 NOTE — Assessment & Plan Note (Signed)
BMI 28.41.  She would like to lose 30 pounds.  Will send in Metformin 500 MG BID due to IFG in past and weight loss desire. Educated her on this and side effects.  Could also consider Contrave or Topamax in future. Her insurance told her she needs to try 3 other medications and fail before she can get injectables.  Recommended eating smaller high protein, low fat meals more frequently and exercising 30 mins a day 5 times a week with a goal of 10-15lb weight loss in the next 3 months. Patient voiced their understanding and motivation to adhere to these recommendations.

## 2022-08-03 ENCOUNTER — Other Ambulatory Visit: Payer: Self-pay | Admitting: Nurse Practitioner

## 2022-08-03 ENCOUNTER — Encounter: Payer: Self-pay | Admitting: Nurse Practitioner

## 2022-08-03 DIAGNOSIS — E7800 Pure hypercholesterolemia, unspecified: Secondary | ICD-10-CM

## 2022-08-03 LAB — COMPREHENSIVE METABOLIC PANEL
ALT: 22 IU/L (ref 0–32)
Albumin: 4.8 g/dL (ref 3.8–4.9)
Alkaline Phosphatase: 92 IU/L (ref 44–121)
BUN/Creatinine Ratio: 27 — ABNORMAL HIGH (ref 9–23)
BUN: 20 mg/dL (ref 6–24)
Bilirubin Total: <0.2 mg/dL (ref 0.0–1.2)
CO2: 20 mmol/L (ref 20–29)
Calcium: 9.8 mg/dL (ref 8.7–10.2)
Globulin, Total: 2.2 g/dL (ref 1.5–4.5)
Potassium: 4.4 mmol/L (ref 3.5–5.2)
Sodium: 139 mmol/L (ref 134–144)
Total Protein: 7 g/dL (ref 6.0–8.5)
eGFR: 93 mL/min/{1.73_m2} (ref >59)

## 2022-08-03 LAB — CBC WITH DIFFERENTIAL/PLATELET
Basophils Absolute: 0.1 10*3/uL (ref 0.0–0.2)
Basos: 1 %
EOS (ABSOLUTE): 0.3 10*3/uL (ref 0.0–0.4)
Hematocrit: 44.6 % (ref 34.0–46.6)
Hemoglobin: 15 g/dL (ref 11.1–15.9)
Immature Grans (Abs): 0 10*3/uL (ref 0.0–0.1)
Immature Granulocytes: 0 %
Lymphocytes Absolute: 3.1 10*3/uL (ref 0.7–3.1)
Lymphs: 34 %
MCH: 29.5 pg (ref 26.6–33.0)
MCHC: 33.6 g/dL (ref 31.5–35.7)
Monocytes Absolute: 0.9 10*3/uL (ref 0.1–0.9)
Monocytes: 10 %
Platelets: 385 10*3/uL (ref 150–450)
RBC: 5.08 x10E6/uL (ref 3.77–5.28)
RDW: 12.4 % (ref 11.7–15.4)
WBC: 9.2 10*3/uL (ref 3.4–10.8)

## 2022-08-03 LAB — LIPID PANEL W/O CHOL/HDL RATIO
Cholesterol, Total: 272 mg/dL — ABNORMAL HIGH (ref 100–199)
LDL Chol Calc (NIH): 190 mg/dL — ABNORMAL HIGH (ref 0–99)
Triglycerides: 188 mg/dL — ABNORMAL HIGH (ref 0–149)

## 2022-08-03 LAB — TSH: TSH: 1.43 u[IU]/mL (ref 0.450–4.500)

## 2022-08-03 LAB — HEPATITIS C ANTIBODY: Hep C Virus Ab: NONREACTIVE

## 2022-08-03 LAB — HIV ANTIBODY (ROUTINE TESTING W REFLEX): HIV Screen 4th Generation wRfx: NONREACTIVE

## 2022-08-03 NOTE — Progress Notes (Signed)
Contacted via MyChart -- lab only visit in 4 weeks please fasting Good morning Christine Chase, your labs have returned: - Kidney function, creatinine and eGFR, remains normal, as is liver function, AST and ALT.  - Hep C and HIV are negative - Thyroid (TSH) and CBC show normal levels. - Cholesterol labs are quite elevated with LDL at 190, were you fasting.  I suspect since afternoon you were not.  I would like to recheck this in 4 weeks outpatient on labs with you fasting please to determine if need for medication.  Any questions? Keep being stellar!!  Thank you for allowing me to participate in your care.  I appreciate you. Kindest regards, Margurete Guaman

## 2022-08-03 NOTE — Progress Notes (Signed)
Also needs 6 month follow-up, she did not schedule:(

## 2022-08-07 DIAGNOSIS — M7662 Achilles tendinitis, left leg: Secondary | ICD-10-CM | POA: Diagnosis not present

## 2022-08-07 DIAGNOSIS — M25572 Pain in left ankle and joints of left foot: Secondary | ICD-10-CM | POA: Diagnosis not present

## 2022-08-07 NOTE — Progress Notes (Signed)
Lab appointment already scheduled on 09/01/2022 @ 8:20 am.

## 2022-08-16 ENCOUNTER — Ambulatory Visit: Payer: 59 | Admitting: Podiatry

## 2022-08-16 ENCOUNTER — Ambulatory Visit (INDEPENDENT_AMBULATORY_CARE_PROVIDER_SITE_OTHER): Payer: 59

## 2022-08-16 DIAGNOSIS — M7752 Other enthesopathy of left foot: Secondary | ICD-10-CM

## 2022-08-16 DIAGNOSIS — S86012A Strain of left Achilles tendon, initial encounter: Secondary | ICD-10-CM | POA: Diagnosis not present

## 2022-08-16 DIAGNOSIS — M62462 Contracture of muscle, left lower leg: Secondary | ICD-10-CM

## 2022-08-16 DIAGNOSIS — M779 Enthesopathy, unspecified: Secondary | ICD-10-CM

## 2022-08-16 MED ORDER — IBUPROFEN 800 MG PO TABS: 800.0000 mg | ORAL_TABLET | Freq: Three times a day (TID) | ORAL | 0 refills | Status: DC | PRN

## 2022-08-16 MED ORDER — DICLOFENAC SODIUM 1 % EX GEL: 2.0000 g | Freq: Four times a day (QID) | CUTANEOUS | 2 refills | Status: DC

## 2022-08-16 NOTE — Progress Notes (Signed)
  Subjective:  Patient ID: Christine Chase, female    DOB: Oct 21, 1965,  MRN: [id number]  Chief Complaint  Patient presents with   Foot Pain    "I have a torn ligament." N - torn ligament L - posterior heel left D - 1 month O - suddenly, gotten worse C - swelling, tender, sore A - wear regular shoes, walking T - [facility], boot, xrays    57 y.o. female presents with the above complaint. History confirmed with patient.  She states she was walking on a treadmill on an incline and increasing her difficulty when she felt something give and pop.  Has been able to ambulate some, urgent care put her in a boot but it somehow feels worse with the boot on all the way.  Meloxicam has not helped so far  Objective:  Physical Exam: warm, good capillary refill, no trophic changes or ulcerative lesions, normal DP and PT pulses, normal sensory exam, and tenderness to insertion of Achilles with significant swelling and edema and heat.  Negative Thompson test   Radiographs: Multiple views x-ray of the left foot: no fracture, dislocation, swelling or degenerative changes noted, posterior calcaneal spur, and Haglund deformity noted Assessment:   1. Partial tear of left Achilles tendon, initial encounter   2. Gastrocnemius equinus of left lower extremity      Plan:  Patient was evaluated and treated and all questions answered.  We reviewed her radiographs taken today and her history and exam and clinical findings.  I am suspicious of a partial Achilles tear considering the sensation that she popped something likely at the insertion there is no evidence of a midsubstance rupture.  I recommended a stat MRI to evaluate further.  I placed plantarflexion wedges into her cam walker boot and she may be nonweightbearing and/or partial weightbearing on this to keep the Achilles relaxed.  Meloxicam has not been helpful I recommended a shorter acting drug such as ibuprofen and or milligrams and  Rx for this was sent to her pharmacy.  We discussed with her I would like her to rest as much as possible utilize ice and elevation.  I will see her back in approximately 1 month and after the MRI to evaluate further, if the MRI is concerning for a rupture that may require surgical repair I will let her know sooner once I have the report  Return in about 1 month (around 09/18/2022) for re-check Achilles tendon, after MRI to review.

## 2022-08-16 NOTE — Patient Instructions (Addendum)
Look for Voltaren gel at the pharmacy over the counter or online (also known as diclofenac 1% gel). Apply to the painful areas 3-4x daily with the supplied dosing card. Allow to dry for 10 minutes before going into socks/shoes      Call [facility] to schedule your MRI at the below locations.  Please allow at least 1 business day after your visit to process the referral.  It may take longer depending on approval from insurance.  Please let me know if you have issues or problems scheduling the MRI   [facility] [phone] [address] [place] [address] [place], [place] [address]  [facility] [phone] [address] [place], [place] [address]

## 2022-09-01 ENCOUNTER — Other Ambulatory Visit: Payer: 59

## 2022-09-01 DIAGNOSIS — E7800 Pure hypercholesterolemia, unspecified: Secondary | ICD-10-CM

## 2022-09-02 LAB — LIPID PANEL W/O CHOL/HDL RATIO
Cholesterol, Total: 220 mg/dL — ABNORMAL HIGH (ref 100–199)
HDL: 46 mg/dL (ref >39)
LDL Chol Calc (NIH): 153 mg/dL — ABNORMAL HIGH (ref 0–99)
Triglycerides: 114 mg/dL (ref 0–149)
VLDL Cholesterol Cal: 21 mg/dL (ref 5–40)

## 2022-09-02 NOTE — Progress Notes (Signed)
Contacted via MyChart The 10-year ASCVD risk score (Arnett DK, et al., 2019) is: 3.7%   Values used to calculate the score:     Age: 57 years     Sex: Female     Is Non-Hispanic African American: No     Diabetic: No     Tobacco smoker: No     Systolic Blood Pressure: 127 mmHg     Is BP treated: Yes     HDL Cholesterol: 46 mg/dL     Total Cholesterol: 220 mg/dL   Good morning Christine Chase, your lab has returned and overall cholesterol levels lower this check with you fasting.  They are still elevated, but recommend ongoing diet focus.  The LDL is the bad cholesterol. Over time and in combination with inflammation and other factors, this contributes to plaque which in turn may lead to stroke and/or heart attack down the road. Sometimes high LDL is primarily genetic, and people might be eating all the right foods but still have high numbers. Other times, there is room for improvement in one's diet and eating healthier can bring this number down and potentially reduce one's risk of heart attack and/or stroke.   To reduce your LDL, Remember - more fruits and vegetables, more fish, and limit red meat and dairy products. More soy, nuts, beans, barley, lentils, oats and plant sterol ester enriched margarine instead of butter. I also encourage eliminating sugar and processed food. Remember, shop on the outside of the grocery store and visit your [employer]. Any questions? Keep being amazing!!  Thank you for allowing me to participate in your care.  I appreciate you. Kindest regards, Christine Chase

## 2022-09-07 ENCOUNTER — Encounter: Payer: Self-pay | Admitting: Podiatry

## 2022-09-07 ENCOUNTER — Inpatient Hospital Stay: Admission: RE | Admit: 2022-09-07 | Payer: 59 | Source: Ambulatory Visit

## 2022-09-10 DIAGNOSIS — R82998 Other abnormal findings in urine: Secondary | ICD-10-CM | POA: Diagnosis not present

## 2022-09-10 DIAGNOSIS — B9689 Other specified bacterial agents as the cause of diseases classified elsewhere: Secondary | ICD-10-CM | POA: Diagnosis not present

## 2022-09-10 DIAGNOSIS — R300 Dysuria: Secondary | ICD-10-CM | POA: Diagnosis not present

## 2022-09-10 DIAGNOSIS — N3001 Acute cystitis with hematuria: Secondary | ICD-10-CM | POA: Diagnosis not present

## 2022-09-12 ENCOUNTER — Encounter: Payer: Self-pay | Admitting: Nurse Practitioner

## 2022-09-12 ENCOUNTER — Ambulatory Visit (INDEPENDENT_AMBULATORY_CARE_PROVIDER_SITE_OTHER): Payer: 59 | Admitting: Nurse Practitioner

## 2022-09-12 ENCOUNTER — Ambulatory Visit: Payer: Self-pay

## 2022-09-12 VITALS — BP 138/75 | HR 98 | Temp 97.8°F | Resp 18 | Ht 67.0 in | Wt 180.6 lb

## 2022-09-12 DIAGNOSIS — R399 Unspecified symptoms and signs involving the genitourinary system: Secondary | ICD-10-CM | POA: Diagnosis not present

## 2022-09-12 DIAGNOSIS — B9689 Other specified bacterial agents as the cause of diseases classified elsewhere: Secondary | ICD-10-CM | POA: Diagnosis not present

## 2022-09-12 DIAGNOSIS — R8281 Pyuria: Secondary | ICD-10-CM | POA: Diagnosis not present

## 2022-09-12 DIAGNOSIS — N760 Acute vaginitis: Secondary | ICD-10-CM

## 2022-09-12 DIAGNOSIS — N3001 Acute cystitis with hematuria: Secondary | ICD-10-CM | POA: Diagnosis not present

## 2022-09-12 LAB — URINALYSIS, ROUTINE W REFLEX MICROSCOPIC
Bilirubin, UA: NEGATIVE
Glucose, UA: NEGATIVE
Ketones, UA: NEGATIVE
Nitrite, UA: NEGATIVE
Specific Gravity, UA: 1.02 (ref 1.005–1.030)
Urobilinogen, Ur: 2 mg/dL — ABNORMAL HIGH (ref 0.2–1.0)
pH, UA: 6 (ref 5.0–7.5)

## 2022-09-12 LAB — MICROSCOPIC EXAMINATION: Bacteria, UA: NONE SEEN

## 2022-09-12 LAB — WET PREP FOR TRICH, YEAST, CLUE
Clue Cell Exam: POSITIVE — AB
Trichomonas Exam: NEGATIVE
Yeast Exam: NEGATIVE

## 2022-09-12 MED ORDER — METRONIDAZOLE 500 MG PO TABS: 500.0000 mg | ORAL_TABLET | Freq: Two times a day (BID) | ORAL | 0 refills | Status: AC

## 2022-09-12 NOTE — Assessment & Plan Note (Signed)
Diagnosed by urgent care, still not feeling 100%.  Continue Macrobid until completed.  UA today trace BLD, Leuks, PRT, no bacteria.  Will send for culture to further assess and extend abx therapy as needed.  Recommend increased hydration and rest.  Work note for week provided.

## 2022-09-12 NOTE — Patient Instructions (Signed)
Urinary Tract Infection, Adult A urinary tract infection (UTI) is an infection of any part of the urinary tract. The urinary tract includes: The kidneys. The ureters. The bladder. The urethra. These organs make, store, and get rid of pee (urine) in the body. What are the causes? This infection is caused by germs (bacteria) in your genital area. These germs grow and cause swelling (inflammation) of your urinary tract. What increases the risk? The following factors may make you more likely to develop this condition: Using a small, thin tube (catheter) to drain pee. Not being able to control when you pee or poop (incontinence). Being female. If you are female, these things can increase the risk: Using these methods to prevent pregnancy: A medicine that kills sperm (spermicide). A device that blocks sperm (diaphragm). Having low levels of a female hormone (estrogen). Being pregnant. You are more likely to develop this condition if: You have genes that add to your risk. You are sexually active. You take antibiotic medicines. You have trouble peeing because of: A prostate that is bigger than normal, if you are female. A blockage in the part of your body that drains pee from the bladder. A kidney stone. A nerve condition that affects your bladder. Not getting enough to drink. Not peeing often enough. You have other conditions, such as: Diabetes. A weak disease-fighting system (immune system). Sickle cell disease. Gout. Injury of the spine. What are the signs or symptoms? Symptoms of this condition include: Needing to pee right away. Peeing small amounts often. Pain or burning when peeing. Blood in the pee. Pee that smells bad or not like normal. Trouble peeing. Pee that is cloudy. Fluid coming from the vagina, if you are female. Pain in the belly or lower back. Other symptoms include: Vomiting. Not feeling hungry. Feeling mixed up (confused). This may be the first symptom in  older adults. Being tired and grouchy (irritable). A fever. Watery poop (diarrhea). How is this treated? Taking antibiotic medicine. Taking other medicines. Drinking enough water. In some cases, you may need to see a specialist. Follow these instructions at home:  Medicines Take over-the-counter and prescription medicines only as told by your doctor. If you were prescribed an antibiotic medicine, take it as told by your doctor. Do not stop taking it even if you start to feel better. General instructions Make sure you: Pee until your bladder is empty. Do not hold pee for a long time. Empty your bladder after sex. Wipe from front to back after peeing or pooping if you are a female. Use each tissue one time when you wipe. Drink enough fluid to keep your pee pale yellow. Keep all follow-up visits. Contact a doctor if: You do not get better after 1-2 days. Your symptoms go away and then come back. Get help right away if: You have very bad back pain. You have very bad pain in your lower belly. You have a fever. You have chills. You feeling like you will vomit or you vomit. Summary A urinary tract infection (UTI) is an infection of any part of the urinary tract. This condition is caused by germs in your genital area. There are many risk factors for a UTI. Treatment includes antibiotic medicines. Drink enough fluid to keep your pee pale yellow. This information is not intended to replace advice given to you by your health care provider. Make sure you discuss any questions you have with your health care provider. Document Revised: 08/03/2019 Document Reviewed: 08/08/2019 Elsevier Patient Education    2024 [employer]  

## 2022-09-12 NOTE — Telephone Encounter (Signed)
  Chief Complaint: At what temperature should a pt go to the ed Symptoms: low temp Frequency: today Pertinent Negatives: Patient denies  Disposition: [] ED /[] Urgent Care (no appt availability in office) / [] Appointment(In office/virtual)/ []  [place] [facility]/ [] Home Care/ [] Refused Recommended Disposition /[] [facility]/ [x]  Follow-up with PCP Additional Notes: Pt states that currently fever is 101.6. PT had a fever over the weekend of 105-106.1. Pt advise to go to ED for fever over 104.    Reason for Disposition  Health Information question, no triage required and triager able to answer question  Answer Assessment - Initial Assessment Questions 1. REASON FOR CALL or QUESTION: "What is your reason for calling today?" or "How can I best help you?" or "What question do you have that I can help answer?"     PT would like to know at what temperature she should go to ED  Protocols used: Information Only Call - No Triage-A-AH

## 2022-09-12 NOTE — Assessment & Plan Note (Signed)
Acute, clue cells noted on wet prep.  Educated her on this finding.  Will send in Flagyl 500 MG BID for 7 days to treat, educated her on this.  Answered all questions.

## 2022-09-12 NOTE — Progress Notes (Signed)
BP 138/75 (BP Location: Left Arm, Patient Position: Sitting, Cuff Size: Normal)   Pulse 98   Temp 97.8 F (36.6 C) (Oral)   Resp 18   Ht 5\' 7"  (1.702 m)   Wt 180 lb 9.6 oz (81.9 kg)   LMP 08/24/2018   SpO2 97%   BMI 28.29 kg/m    Subjective:    Patient ID: Christine Chase, female    DOB: [birth year], 57 y.o.   MRN: [id number]  HPI: Christine Chase is a 57 y.o. female  Chief Complaint  Patient presents with   Urinary Tract Infection   URINARY SYMPTOMS Was seen in [place] at the urgent care, then Friday night had cold chills and N&V.  Had a fever 105.1 at the time, this came down to 101.2 next day.  In urgent care was told she had a kidney infection, treated with Macrobid.  She reports still feeling bad, had a fever last night but not this morning.  Continues to have lower left back pain and fatigue.    History of kidney stones 5 years ago. Dysuria:  initially, not current Urinary frequency:  initially, but improved at this time Urgency: no Small volume voids: no Symptom severity: no Urinary incontinence: no Foul odor: no Hematuria: no Abdominal pain: no Back pain: yes left lower Suprapubic pain/pressure: no Flank pain: no Fever:  yes for 2-3 days Vomiting: yes the first night -- then nausea Relief with cranberry juice: no Relief with pyridium: no Status: stable -- not 100% Previous urinary tract infection: yes Recurrent urinary tract infection: no Sexual activity: monogamous History of sexually transmitted disease: no Treatments attempted: antibiotics, pyridium, and increasing fluids       09/12/2022    1:52 PM 01/26/2022    8:04 AM 05/20/2020    8:37 AM  Depression screen PHQ 2/9  Decreased Interest 1 0 0  Down, Depressed, Hopeless 0 0 0  PHQ - 2 Score 1 0 0  Altered sleeping 0 1   Tired, decreased energy 1 0   Change in appetite 1 0   Feeling bad or failure about yourself  0 0   Trouble concentrating 0 0   Moving slowly or fidgety/restless 0 0   Suicidal  thoughts 0 0   PHQ-9 Score 3 1        09/12/2022    1:52 PM 01/26/2022    8:05 AM  GAD 7 : Generalized Anxiety Score  Nervous, Anxious, on Edge 0 0  Control/stop worrying 0 0  Worry too much - different things 0 0  Trouble relaxing 0 0  Restless 0 0  Easily annoyed or irritable 0 1  Afraid - awful might happen 0 0  Total GAD 7 Score 0 1  Anxiety Difficulty Not difficult at all Not difficult at all      Relevant past medical, surgical, family and social history reviewed and updated as indicated. Interim medical history since our last visit reviewed. Allergies and medications reviewed and updated.  Review of Systems  Constitutional:  Negative for activity change, appetite change, diaphoresis, fatigue and fever.  Respiratory:  Negative for cough, chest tightness and shortness of breath.   Cardiovascular:  Negative for chest pain, palpitations and leg swelling.  Gastrointestinal:  Positive for abdominal pain and nausea. Negative for abdominal distention, constipation, diarrhea and vomiting.  Endocrine: Negative for cold intolerance.  Genitourinary:  Negative for difficulty urinating, dysuria, frequency, urgency and vaginal discharge.  Musculoskeletal:  Positive for back pain.  Psychiatric/Behavioral: Negative.  Per HPI unless specifically indicated above     Objective:    BP 138/75 (BP Location: Left Arm, Patient Position: Sitting, Cuff Size: Normal)   Pulse 98   Temp 97.8 F (36.6 C) (Oral)   Resp 18   Ht 5\' 7"  (1.702 m)   Wt 180 lb 9.6 oz (81.9 kg)   LMP 08/24/2018   SpO2 97%   BMI 28.29 kg/m   Wt Readings from Last 3 Encounters:  09/12/22 180 lb 9.6 oz (81.9 kg)  08/02/22 181 lb 6.4 oz (82.3 kg)  10/30/21 183 lb (83 kg)    Physical Exam Vitals and nursing note reviewed.  Constitutional:      General: She is awake. She is not in acute distress.    Appearance: She is well-developed and well-groomed. She is not ill-appearing or toxic-appearing.  HENT:     Head:  Normocephalic.     Right Ear: Hearing and external ear normal.     Left Ear: Hearing and external ear normal.  Eyes:     General: Lids are normal.        Right eye: No discharge.        Left eye: No discharge.     Conjunctiva/sclera: Conjunctivae normal.     Pupils: Pupils are equal, round, and reactive to light.  Neck:     Thyroid: No thyromegaly.     Vascular: No carotid bruit.  Cardiovascular:     Rate and Rhythm: Normal rate and regular rhythm.     Heart sounds: Normal heart sounds. No murmur heard.    No gallop.  Pulmonary:     Effort: Pulmonary effort is normal. No accessory muscle usage or respiratory distress.     Breath sounds: Normal breath sounds.  Abdominal:     General: Bowel sounds are normal. There is no distension.     Palpations: Abdomen is soft.     Tenderness: There is generalized abdominal tenderness. There is left CVA tenderness (mild tenderness noted). There is no right CVA tenderness, guarding or rebound. Negative signs include Murphy's sign.  Musculoskeletal:     Cervical back: Normal range of motion and neck supple.     Right lower leg: No edema.     Left lower leg: No edema.  Lymphadenopathy:     Cervical: No cervical adenopathy.  Skin:    General: Skin is warm and dry.  Neurological:     Mental Status: She is alert and oriented to person, place, and time.     Deep Tendon Reflexes: Reflexes are normal and symmetric.     Reflex Scores:      Brachioradialis reflexes are 2+ on the right side and 2+ on the left side.      Patellar reflexes are 2+ on the right side and 2+ on the left side. Psychiatric:        Attention and Perception: Attention normal.        Mood and Affect: Mood normal.        Speech: Speech normal.        Behavior: Behavior normal. Behavior is cooperative.        Thought Content: Thought content normal.    Results for orders placed or performed in visit on 09/01/22  Lipid Panel w/o Chol/HDL Ratio  Result Value Ref Range    Cholesterol, Total 220 (H) 100 - 199 mg/dL   Triglycerides 161 0 - 149 mg/dL   HDL 46 >09 mg/dL   VLDL Cholesterol Cal 21 5 -  40 mg/dL   LDL Chol Calc (NIH) 629 (H) 0 - 99 mg/dL      Assessment & Plan:   Problem List Items Addressed This Visit       Genitourinary   Acute cystitis with hematuria - Primary    Diagnosed by urgent care, still not feeling 100%.  Continue Macrobid until completed.  UA today trace BLD, Leuks, PRT, no bacteria.  Will send for culture to further assess and extend abx therapy as needed.  Recommend increased hydration and rest.  Work note for week provided.      Relevant Orders   Urinalysis, Routine w reflex microscopic   WET PREP FOR TRICH, YEAST, CLUE   Bacterial vaginosis    Acute, clue cells noted on wet prep.  Educated her on this finding.  Will send in Flagyl 500 MG BID for 7 days to treat, educated her on this.  Answered all questions.      Relevant Medications   metroNIDAZOLE (FLAGYL) 500 MG tablet   Other Visit Diagnoses     Pyuria       Urine sent for culture to further assess.   Relevant Orders   Urine Culture        Follow up plan: Return if symptoms worsen or fail to improve.

## 2022-09-13 ENCOUNTER — Emergency Department: Payer: 59

## 2022-09-13 ENCOUNTER — Emergency Department
Admission: EM | Admit: 2022-09-13 | Discharge: 2022-09-13 | Disposition: A | Discharge status: Home / Self Care | Payer: 59 | Attending: Emergency Medicine | Admitting: Emergency Medicine

## 2022-09-13 ENCOUNTER — Encounter: Payer: Self-pay | Admitting: Emergency Medicine

## 2022-09-13 ENCOUNTER — Other Ambulatory Visit: Payer: Self-pay

## 2022-09-13 DIAGNOSIS — N3001 Acute cystitis with hematuria: Secondary | ICD-10-CM | POA: Diagnosis not present

## 2022-09-13 DIAGNOSIS — N10 Acute tubulo-interstitial nephritis: Secondary | ICD-10-CM | POA: Diagnosis not present

## 2022-09-13 DIAGNOSIS — N12 Tubulo-interstitial nephritis, not specified as acute or chronic: Secondary | ICD-10-CM

## 2022-09-13 DIAGNOSIS — K5730 Diverticulosis of large intestine without perforation or abscess without bleeding: Secondary | ICD-10-CM | POA: Diagnosis not present

## 2022-09-13 DIAGNOSIS — R1032 Left lower quadrant pain: Secondary | ICD-10-CM | POA: Diagnosis not present

## 2022-09-13 DIAGNOSIS — K760 Fatty (change of) liver, not elsewhere classified: Secondary | ICD-10-CM | POA: Diagnosis not present

## 2022-09-13 LAB — URINALYSIS, ROUTINE W REFLEX MICROSCOPIC
Bilirubin Urine: NEGATIVE
Glucose, UA: NEGATIVE mg/dL
Hgb urine dipstick: NEGATIVE
Ketones, ur: NEGATIVE mg/dL
Nitrite: NEGATIVE
Protein, ur: NEGATIVE mg/dL
Specific Gravity, Urine: 1.019 (ref 1.005–1.030)
pH: 5 (ref 5.0–8.0)

## 2022-09-13 LAB — COMPREHENSIVE METABOLIC PANEL
ALT: 48 U/L — ABNORMAL HIGH (ref 0–44)
AST: 26 U/L (ref 15–41)
Albumin: 3.5 g/dL (ref 3.5–5.0)
Alkaline Phosphatase: 72 U/L (ref 38–126)
Anion gap: 12 (ref 5–15)
BUN: 24 mg/dL — ABNORMAL HIGH (ref 6–20)
CO2: 20 mmol/L — ABNORMAL LOW (ref 22–32)
Calcium: 8.4 mg/dL — ABNORMAL LOW (ref 8.9–10.3)
Chloride: 104 mmol/L (ref 98–111)
Creatinine, Ser: 0.71 mg/dL (ref 0.44–1.00)
GFR, Estimated: >60 mL/min (ref >60)
Glucose, Bld: 98 mg/dL (ref 70–99)
Potassium: 3.2 mmol/L — ABNORMAL LOW (ref 3.5–5.1)
Sodium: 136 mmol/L (ref 135–145)
Total Bilirubin: 0.3 mg/dL (ref 0.3–1.2)
Total Protein: 7.3 g/dL (ref 6.5–8.1)

## 2022-09-13 LAB — CBC
HCT: 38.4 % (ref 36.0–46.0)
Hemoglobin: 12.7 g/dL (ref 12.0–15.0)
MCH: 29.1 pg (ref 26.0–34.0)
MCHC: 33.1 g/dL (ref 30.0–36.0)
MCV: 87.9 fL (ref 80.0–100.0)
Platelets: 338 10*3/uL (ref 150–400)
RBC: 4.37 MIL/uL (ref 3.87–5.11)
RDW: 12.3 % (ref 11.5–15.5)
WBC: 5.8 10*3/uL (ref 4.0–10.5)
nRBC: 0 % (ref 0.0–0.2)

## 2022-09-13 LAB — LIPASE, BLOOD: Lipase: 33 U/L (ref 11–51)

## 2022-09-13 LAB — LACTIC ACID, PLASMA: Lactic Acid, Venous: 1.5 mmol/L (ref 0.5–1.9)

## 2022-09-13 MED ORDER — IOHEXOL 300 MG/ML  SOLN
100.0000 mL | Freq: Once | INTRAMUSCULAR | Status: AC | PRN
  Administered 2022-09-13: 100 mL via INTRAVENOUS

## 2022-09-13 MED ORDER — SODIUM CHLORIDE 0.9 % IV SOLN
2.0000 g | Freq: Once | INTRAVENOUS | Status: AC
  Administered 2022-09-13: 2 g via INTRAVENOUS
  Filled 2022-09-13: qty 20

## 2022-09-13 MED ORDER — LACTATED RINGERS IV BOLUS
1000.0000 mL | Freq: Once | INTRAVENOUS | Status: AC
  Administered 2022-09-13: 1000 mL via INTRAVENOUS

## 2022-09-13 MED ORDER — CEFDINIR 300 MG PO CAPS
300.0000 mg | ORAL_CAPSULE | Freq: Two times a day (BID) | ORAL | 0 refills | Status: AC
Start: 2022-09-13 — End: 2022-09-23

## 2022-09-13 NOTE — ED Triage Notes (Signed)
Patient to ED via POV for fever since the weekend. States she was dx with a kidney infection- given a antibiotic shot on Sunday and now currently taking antibiotic from PCP. PCP concerned for sepsis. C/o left flank pain.

## 2022-09-13 NOTE — ED Provider Notes (Signed)
[facility] Provider Note    Event Date/Time   First MD Initiated Contact with Patient 09/13/22 1412     (approximate)   History   Fever   HPI Ardra Insana is a 57 y.o. female presenting today for fever.  Patient reportedly was diagnosed with a UTI 5 days ago after having dysuria symptoms.  Was seen in urgent care and given a shot in the arm which I presume is ceftriaxone and discharged on Macrobid.  She noted recurrent fevers earlier this week and was seen by her PCP who changed her to Flagyl.  Patient is continue to feel unwell and reports skin thermometer indicating a temperature of 100 at home.  Her PCP sent her in for concern of sepsis.  Patient also noting some left-sided flank pain as well as left lower quadrant pain.  Otherwise denies cough, congestion, nausea, vomiting, diarrhea, constipation.  Currently, does not have any more dysuria symptoms.     Physical Exam   Triage Vital Signs: ED Triage Vitals [09/13/22 1238]  Encounter Vitals Group     BP (!) 127/90     Systolic BP Percentile      Diastolic BP Percentile      Pulse Rate 85     Resp 18     Temp 97.9 F (36.6 C)     Temp Source Oral     SpO2 95 %     Weight 176 lb (79.8 kg)     Height 5\' 5"  (1.651 m)     Head Circumference      Peak Flow      Pain Score 8     Pain Loc      Pain Education      Exclude from Growth Chart     Most recent vital signs: Vitals:   09/13/22 1238  BP: (!) 127/90  Pulse: 85  Resp: 18  Temp: 97.9 F (36.6 C)  SpO2: 95%   Physical Exam: I have reviewed the vital signs and nursing notes. General: Awake, alert, no acute distress.  Nontoxic appearing. Head:  Atraumatic, normocephalic.   ENT:  EOM intact, PERRL. Oral mucosa is pink and moist with no lesions. Neck: Neck is supple with full range of motion, No meningeal signs. Cardiovascular:  RRR, No murmurs. Peripheral pulses palpable and equal bilaterally. Respiratory:  Symmetrical chest wall  expansion.  No rhonchi, rales, or wheezes.  Good air movement throughout.  No use of accessory muscles.   Musculoskeletal:  No cyanosis or edema. Moving extremities with full ROM Abdomen:  Soft, mild left CVA tenderness, mild left lower quadrant tenderness to palpation, nondistended. Neuro:  GCS 15, moving all four extremities, interacting appropriately. Speech clear. Psych:  Calm, appropriate.   Skin:  Warm, dry, no rash.    ED Results / Procedures / Treatments   Labs (all labs ordered are listed, but only abnormal results are displayed) Labs Reviewed  COMPREHENSIVE METABOLIC PANEL - Abnormal; Notable for the following components:      Result Value   Potassium 3.2 (*)    CO2 20 (*)    BUN 24 (*)    Calcium 8.4 (*)    ALT 48 (*)    All other components within normal limits  URINALYSIS, ROUTINE W REFLEX MICROSCOPIC - Abnormal; Notable for the following components:   Color, Urine YELLOW (*)    APPearance HAZY (*)    Leukocytes,Ua SMALL (*)    Bacteria, UA RARE (*)    All  other components within normal limits  LIPASE, BLOOD  CBC  LACTIC ACID, PLASMA     EKG    RADIOLOGY CT abdomen/pelvis pending at time of signout   PROCEDURES:  Critical Care performed: No  Procedures   MEDICATIONS ORDERED IN ED: Medications  lactated ringers bolus 1,000 mL (1,000 mLs Intravenous New Bag/Given 09/13/22 1524)     IMPRESSION / MDM / ASSESSMENT AND PLAN / ED COURSE  I reviewed the triage vital signs and the nursing notes.                              Differential diagnosis includes, but is not limited to, UTI, pyelonephritis, nephrolithiasis, diverticulitis.  Patient's presentation is most consistent with acute illness / injury with system symptoms.  Patient is a 57 year old female presenting today for ongoing urinary tract symptoms associated with weakness and fevers.  Vital signs stable on arrival.  No evidence of leukocytosis, elevated lactic, or other signs indicative of  sepsis at this point.  UA does still appear to indicate UTI at this time.  CT abdomen/pelvis was ordered to evaluate for signs of pyelonephritis versus nephrolithiasis versus diverticulitis given left-sided flank symptoms.  Suspect patient will likely be able to be discharged as she has no evidence of sepsis.  Patient was reportedly just switched to Flagyl which is likely not the best for covering her urinary tract infection and will need to be switched at time of discharge depending on results of CT abdomen/pelvis.  Patient was given 1 L fluids here in the emergency department for rehydration.  Patient signed out to oncoming provider.  The patient is on the cardiac monitor to evaluate for evidence of arrhythmia and/or significant heart rate changes. Clinical Course as of 09/13/22 1529  Wed Sep 13, 2022  1426 Lipase: 33 [DW]  1426 CBC No leukocytosis with stable vital signs indicating no concern for sepsis at this time [DW]  1501 Lactic Acid, Venous: 1.5 [DW]  1515 Urinalysis, Routine w reflex microscopic -Urine, Clean Catch(!) UA appears indicative of likely ongoing UTI [DW]    Clinical Course User Index [DW] Janith Lima, MD     FINAL CLINICAL IMPRESSION(S) / ED DIAGNOSES   Final diagnoses:  Acute cystitis with hematuria     Rx / DC Orders   ED Discharge Orders     None        Note:  This document was prepared using Dragon voice recognition software and may include unintentional dictation errors.   Janith Lima, MD 09/13/22 [phone]

## 2022-09-13 NOTE — ED Provider Notes (Signed)
Procedures  Clinical Course as of 09/13/22 1721  Wed Sep 13, 2022  1426 Lipase: 33 [DW]  1426 CBC No leukocytosis with stable vital signs indicating no concern for sepsis at this time [DW]  1501 Lactic Acid, Venous: 1.5 [DW]  1515 Urinalysis, Routine w reflex microscopic -Urine, Clean Catch(!) UA appears indicative of likely ongoing UTI [DW]    Clinical Course User Index [DW] Janith Lima, MD    ----------------------------------------- 5:21 PM on 09/13/2022 -----------------------------------------  CT abdomen pelvis reveals right pyelonephritis.  Vital signs and labs are normal, patient is not septic.  She is nontoxic, comfortable with continued outpatient management.  Counseled her to discontinue Flagyl, finish her course of Bactrim, and start Omnicef.  Will give a dose of Rocephin here in the ED prior to discharge.  Usual return precautions for worsening of condition or failure to improve over the next 72 hours.  Final diagnoses:  Acute cystitis with hematuria  Pyelonephritis of right kidney       Sharman Cheek, MD 09/13/22 [phone]

## 2022-09-14 LAB — URINE CULTURE
Culture: NO GROWTH
Organism ID, Bacteria: NO GROWTH

## 2022-09-14 NOTE — Progress Notes (Signed)
Contacted via MyChart

## 2022-09-15 ENCOUNTER — Encounter: Payer: Self-pay | Admitting: Podiatry

## 2022-09-20 ENCOUNTER — Ambulatory Visit: Payer: 59 | Admitting: Podiatry

## 2022-09-26 ENCOUNTER — Inpatient Hospital Stay: Admission: RE | Admit: 2022-09-26 | Payer: 59 | Source: Ambulatory Visit

## 2022-10-05 ENCOUNTER — Telehealth: Payer: Self-pay

## 2022-10-05 NOTE — Telephone Encounter (Signed)
Transition Care Management Unsuccessful Follow-up Telephone Call  Date of discharge and from where:  09/13/2022 [facility]  Attempts:  1st Attempt  Reason for unsuccessful TCM follow-up call:  Left voice message  Katrinia Straker [facility]  [facility], [facility] Resource Care Guide Direct Dial: [phone]  Website: [email or web address]

## 2022-10-05 NOTE — Telephone Encounter (Signed)
Transition Care Management Follow-up Telephone Call Date of discharge and from where: 09/13/2022 [facility] How have you been since you were released from the hospital? Patient stated she is feeling fine, no further symptoms. Any questions or concerns? No  Items Reviewed: Did the pt receive and understand the discharge instructions provided? Yes  Medications obtained and verified? Yes  Other? No  Any new allergies since your discharge? No  Dietary orders reviewed? Yes Do you have support at home? Yes   Follow up appointments reviewed:  PCP Hospital f/u appt confirmed?  Patient stated she is feeling better and has taken all her medication.  Scheduled to see  on  @ . Specialist Hospital f/u appt confirmed? No  Scheduled to see  on  @ . Are transportation arrangements needed? No  If their condition worsens, is the pt aware to call PCP or go to the Emergency Dept.? Yes Was the patient provided with contact information for the PCP's office or ED? Yes Was to pt encouraged to call back with questions or concerns? Yes  Deija Buhrman [facility]  [facility], [facility] Guide Direct Dial: [phone]  Website: [email or web address]

## 2022-10-19 ENCOUNTER — Other Ambulatory Visit: Payer: Self-pay | Admitting: Nurse Practitioner

## 2022-10-19 NOTE — Telephone Encounter (Signed)
Unable to refi/ll per protocol, Rx expired. Meloxicam discontinued 08/16/22.  Requested Prescriptions  Pending Prescriptions Disp Refills   meloxicam (MOBIC) 15 MG tablet [Pharmacy Med Name: MELOXICAM 15 MG TABLET] 30 tablet 0    Sig: TAKE 1 TABLET (15 MG TOTAL) BY MOUTH DAILY.     Analgesics:  COX2 Inhibitors Failed - 10/19/2022 12:36 PM      Failed - Manual Review: Labs are only required if the patient has taken medication for more than 8 weeks.      Failed - ALT in normal range and within 360 days    ALT  Date Value Ref Range Status  09/13/2022 48 (H) 0 - 44 U/L Final   SGPT (ALT)  Date Value Ref Range Status  12/13/2011 52 12 - 78 U/L Final         Passed - HGB in normal range and within 360 days    Hemoglobin  Date Value Ref Range Status  09/13/2022 12.7 12.0 - 15.0 g/dL Final  62/95/2841 32.4 11.1 - 15.9 g/dL Final         Passed - Cr in normal range and within 360 days    Creatinine  Date Value Ref Range Status  12/13/2011 0.70 0.60 - 1.30 mg/dL Final   Creatinine, Ser  Date Value Ref Range Status  09/13/2022 0.71 0.44 - 1.00 mg/dL Final         Passed - HCT in normal range and within 360 days    HCT  Date Value Ref Range Status  09/13/2022 38.4 36.0 - 46.0 % Final   Hematocrit  Date Value Ref Range Status  08/02/2022 44.6 34.0 - 46.6 % Final         Passed - AST in normal range and within 360 days    AST  Date Value Ref Range Status  09/13/2022 26 15 - 41 U/L Final   SGOT(AST)  Date Value Ref Range Status  12/13/2011 34 15 - 37 Unit/L Final         Passed - eGFR is 30 or above and within 360 days    EGFR (African American)  Date Value Ref Range Status  12/13/2011 >60  Final   GFR calc Af Amer  Date Value Ref Range Status  10/02/2016 >60 >60 mL/min Final    Comment:    (NOTE) The eGFR has been calculated using the CKD EPI equation. This calculation has not been validated in all clinical situations. eGFR's persistently <60 mL/min signify  possible Chronic Kidney Disease.    EGFR (Non-African Amer.)  Date Value Ref Range Status  12/13/2011 >60  Final    Comment:    eGFR values <43mL/min/1.73 m2 may be an indication of chronic kidney disease (CKD). Calculated eGFR is useful in patients with stable renal function. The eGFR calculation will not be reliable in acutely ill patients when serum creatinine is changing rapidly. It is not useful in  patients on dialysis. The eGFR calculation may not be applicable to patients at the low and high extremes of body sizes, pregnant women, and vegetarians.    GFR, Estimated  Date Value Ref Range Status  09/13/2022 >60 >60 mL/min Final    Comment:    (NOTE) Calculated using the CKD-EPI Creatinine Equation (2021)    eGFR  Date Value Ref Range Status  08/02/2022 93 >59 mL/min/1.73 Final         Passed - Patient is not pregnant      Passed - Valid encounter within  last 12 months    Recent Outpatient Visits           1 month ago Acute cystitis with hematuria   [place] [facility], Cecilia T, NP   2 months ago Essential hypertension   [place] [facility] [place], [place] T, NP   8 months ago Lab test positive for detection of COVID-19 virus   [place] [facility] [place], Corrie Dandy T, NP   1 year ago Essential hypertension   [place] [facility] [place], Corrie Dandy T, NP   2 years ago Encounter to establish care   [place] [facility] [place], Dorie Rank, NP       Future Appointments             In 3 months Cannady, Dorie Rank, NP [place] [facility], [facility]             metroNIDAZOLE (FLAGYL) 500 MG tablet [Pharmacy Med Name: METRONIDAZOLE 500 MG TABLET] 14 tablet 0    Sig: TAKE 1 TABLET BY MOUTH TWICE A DAY FOR 7 DAYS     Off-Protocol Failed - 10/19/2022 12:36 PM      Failed - Medication not assigned to a protocol, review manually.      Passed - Valid encounter  within last 12 months    Recent Outpatient Visits           1 month ago Acute cystitis with hematuria   [place] [facility] [place], Corrie Dandy T, NP   2 months ago Essential hypertension   [place] [facility] [place], [place] T, NP   8 months ago Lab test positive for detection of COVID-19 virus   [place] [facility] [place], Corrie Dandy T, NP   1 year ago Essential hypertension   [place] [facility] [place], Corrie Dandy T, NP   2 years ago Encounter to establish care   [place] [facility] [place], Dorie Rank, NP       Future Appointments             In 3 months Cannady, Dorie Rank, NP [place] [facility], [facility]

## 2022-10-20 ENCOUNTER — Ambulatory Visit (INDEPENDENT_AMBULATORY_CARE_PROVIDER_SITE_OTHER): Payer: 59 | Admitting: Family Medicine

## 2022-10-20 VITALS — BP 122/80 | HR 79 | Temp 98.1°F | Ht 64.96 in | Wt 178.4 lb

## 2022-10-20 DIAGNOSIS — R399 Unspecified symptoms and signs involving the genitourinary system: Secondary | ICD-10-CM

## 2022-10-20 DIAGNOSIS — F4320 Adjustment disorder, unspecified: Secondary | ICD-10-CM | POA: Insufficient documentation

## 2022-10-20 LAB — URINALYSIS, ROUTINE W REFLEX MICROSCOPIC
Bilirubin, UA: NEGATIVE
Glucose, UA: NEGATIVE
Ketones, UA: NEGATIVE
Leukocytes,UA: NEGATIVE
Nitrite, UA: NEGATIVE
RBC, UA: NEGATIVE
Specific Gravity, UA: >1.03 — ABNORMAL HIGH (ref 1.005–1.030)
Urobilinogen, Ur: 0.2 mg/dL (ref 0.2–1.0)
pH, UA: 5.5 (ref 5.0–7.5)

## 2022-10-20 MED ORDER — LORAZEPAM 0.5 MG PO TABS: 0.5000 mg | ORAL_TABLET | Freq: Every day | ORAL | 1 refills | Status: DC | PRN

## 2022-10-20 NOTE — Assessment & Plan Note (Addendum)
Acute, ongoing. Recently received news of moms cancer diagnosis, and was told had 4 months to live. Will treat with 0.5 MG Ativan daily as needed. Return in 1 month, call sooner if concerns arise.

## 2022-10-20 NOTE — Patient Instructions (Addendum)
Increase water intake to 90 ounces daily  Take over the counter AZO if symptoms worsen

## 2022-10-20 NOTE — Progress Notes (Signed)
BP 122/80   Pulse 79   Temp 98.1 F (36.7 C) (Oral)   Ht 5' 4.96" (1.65 m)   Wt 178 lb 6.4 oz (80.9 kg)   LMP 08/24/2018   SpO2 97%   BMI 29.72 kg/m    Subjective:    Patient ID: Christine Chase, female    DOB: 1965-09-18, 57 y.o.   MRN: [id number]  HPI: Christine Chase is a 57 y.o. female  Chief Complaint  Patient presents with   urinary symptoms   URINARY SYMPTOMS 1 month ago she was treated for UTI and BV. Complains of symptoms starting two days ago.  Dysuria: no Urinary frequency: yes Urgency: yes Small volume voids: yes Symptom severity: yes Urinary incontinence: no Foul odor: no Hematuria: no Abdominal pain: no Back pain: yes Suprapubic pain/pressure: no Flank pain: No Fever: No Vomiting: yes Relief with cranberry juice:  Has not tried Relief with pyridium:  Does not take any Status: better/worse/stable: worse Previous urinary tract infection: yes Recurrent urinary tract infection: no Sexual activity: No sexually active/monogomous/practicing safe sex: Yes History of sexually transmitted disease: no Treatments attempted: increasing fluids    MOOD GAD7: 4, PHQ 9: 2 Patient is very anxious and distraught about her mom who was recently diagnosed with cancer and told she only had 4 months to live. 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      10/20/2022   10:27 AM 09/12/2022    1:52 PM 01/26/2022    8:04 AM  Depression screen PHQ 2/9  Decreased Interest 0 1 0  Down, Depressed, Hopeless 1 0 0  PHQ - 2 Score 1 1 0  Altered sleeping 1 0 1  Tired, decreased energy 0 1 0  Change in appetite 0 1 0  Feeling bad or failure about yourself  0 0 0  Trouble concentrating 0 0 0  Moving slowly or fidgety/restless 0 0 0  Suicidal thoughts 0 0 0  PHQ-9 Score 2 3 1   Difficult doing work/chores Not difficult at all          10/20/2022   10:27 AM 09/12/2022    1:52 PM 01/26/2022    8:05 AM  GAD 7 : Generalized Anxiety Score  Nervous, Anxious, on Edge 1 0 0  Control/stop worrying 1 0 0  Worry too much - different things 0 0 0  Trouble relaxing 1 0 0  Restless 1 0 0  Easily annoyed or irritable 0 0 1  Afraid - awful might happen 0 0 0  Total GAD 7 Score 4 0 1  Anxiety Difficulty Not difficult at all Not difficult at all Not difficult at all     Relevant past medical, surgical, family and social history reviewed and updated as indicated. Interim medical history since our last visit reviewed. Allergies and medications reviewed and updated.  Review of Systems  Constitutional:  Negative for chills and fever.  Respiratory: Negative.    Cardiovascular: Negative.   Gastrointestinal:  Positive for vomiting.  Genitourinary:  Positive for frequency and urgency. Negative for decreased urine volume, difficulty urinating, dyspareunia, dysuria, flank pain, hematuria and pelvic pain.  Musculoskeletal:  Positive for back pain.  Psychiatric/Behavioral:  Positive for decreased concentration and dysphoric mood. Negative for confusion and suicidal ideas. The patient is nervous/anxious.     Per  HPI unless specifically indicated above     Objective:    BP 122/80   Pulse 79   Temp 98.1 F (36.7 C) (Oral)   Ht 5' 4.96" (1.65 m)   Wt 178 lb 6.4 oz (80.9 kg)   LMP 08/24/2018   SpO2 97%   BMI 29.72 kg/m   Wt Readings from Last 3 Encounters:  10/20/22 178 lb 6.4 oz (80.9 kg)  09/13/22 176 lb (79.8 kg)  09/12/22 180 lb 9.6 oz (81.9 kg)    Physical Exam Vitals and nursing note reviewed.  Constitutional:      General: She is awake. She is not in acute distress.    Appearance: Normal appearance. She is well-developed and well-groomed. She is not ill-appearing.  HENT:     Head: Normocephalic and atraumatic.     Right Ear: Hearing and external ear normal. No drainage.     Left Ear: Hearing and external ear  normal. No drainage.     Nose: Nose normal.  Eyes:     General: Lids are normal.        Right eye: No discharge.        Left eye: No discharge.     Conjunctiva/sclera: Conjunctivae normal.  Cardiovascular:     Rate and Rhythm: Normal rate and regular rhythm.     Heart sounds: Normal heart sounds, S1 normal and S2 normal. No murmur heard.    No gallop.  Pulmonary:     Effort: Pulmonary effort is normal. No accessory muscle usage or respiratory distress.     Breath sounds: Normal breath sounds.  Abdominal:     General: Bowel sounds are normal.     Tenderness: There is no abdominal tenderness. There is no right CVA tenderness or left CVA tenderness.  Musculoskeletal:        General: Normal range of motion.     Cervical back: Full passive range of motion without pain and normal range of motion.     Right lower leg: No edema.     Left lower leg: No edema.  Skin:    General: Skin is warm and dry.     Capillary Refill: Capillary refill takes less than 2 seconds.  Neurological:     Mental Status: She is alert and oriented to person, place, and time.  Psychiatric:        Attention and Perception: Attention normal.        Mood and Affect: Mood normal.        Speech: Speech normal.        Behavior: Behavior normal. Behavior is cooperative.        Thought Content: Thought content normal.     Results for orders placed or performed in visit on 10/20/22  Urinalysis, Routine w reflex microscopic  Result Value Ref Range   Specific Gravity, UA >1.030 (H) 1.005 - 1.030   pH, UA 5.5 5.0 - 7.5   Color, UA Yellow Yellow   Appearance Ur Clear Clear   Leukocytes,UA Negative Negative   Protein,UA Trace (A) Negative/Trace   Glucose, UA Negative Negative   Ketones, UA Negative Negative   RBC, UA Negative Negative   Bilirubin, UA Negative Negative   Urobilinogen, Ur 0.2 0.2 - 1.0 mg/dL   Nitrite, UA Negative Negative   Microscopic Examination Comment       Assessment & Plan:   Problem  List Items Addressed This Visit     Anticipatory grief    Acute, ongoing. Recently received news  of moms cancer diagnosis, and was told had 4 months to live. Will treat with 0.5 MG Ativan daily as needed. Return in 1 month, call sooner if concerns arise.       Other Visit Diagnoses     Urinary tract infection symptoms    -  Primary   Acute, stable. UA negative, will send for culture. Recommend use of AZO if symptoms start to worsen. Recommend increasing daily water intake.   Relevant Orders   Urinalysis, Routine w reflex microscopic (Completed)   Urine Culture        Follow up plan: Return in about 1 month (around 11/20/2022) for Follow up mood.

## 2022-10-22 LAB — URINE CULTURE

## 2022-10-22 NOTE — Progress Notes (Signed)
Hi Christine Chase, urine culture is negative. Thank you for allowing me to participate in your care.

## 2022-10-23 ENCOUNTER — Other Ambulatory Visit: Payer: Self-pay

## 2022-10-23 ENCOUNTER — Emergency Department
Admission: EM | Admit: 2022-10-23 | Discharge: 2022-10-23 | Disposition: A | Discharge status: Home / Self Care | Payer: 59 | Attending: Emergency Medicine | Admitting: Emergency Medicine

## 2022-10-23 DIAGNOSIS — M79605 Pain in left leg: Secondary | ICD-10-CM | POA: Diagnosis not present

## 2022-10-23 DIAGNOSIS — M7632 Iliotibial band syndrome, left leg: Secondary | ICD-10-CM | POA: Insufficient documentation

## 2022-10-23 NOTE — ED Notes (Signed)
See triage note  Presents with left leg pain  Denies any recent injury  But states she did wear a boot several weeks  Pain is radiating into hip area

## 2022-10-23 NOTE — ED Provider Notes (Signed)
[facility] Provider Note    Event Date/Time   First MD Initiated Contact with Patient 10/23/22 [phone]     (approximate)   History   Leg Pain   HPI  Christine Chase is a 57 y.o. female who presents to the emergency department today because of concerns for left leg pain.  The patient describes the pain is located on the outside of her left thigh.  It radiates from her knee up into her hip.  The patient says that she recently came out of a left foot boot secondary to a Achilles injury.  She has been following up with podiatry for this and states that according to the most recent imaging it has healed.  The patient says that her current pain along her left thigh is worse with movement and walking.     Physical Exam   Triage Vital Signs: ED Triage Vitals  Encounter Vitals Group     BP 10/23/22 0747 139/88     Systolic BP Percentile --      Diastolic BP Percentile --      Pulse Rate 10/23/22 0747 83     Resp 10/23/22 0747 18     Temp 10/23/22 0747 98.3 F (36.8 C)     Temp src --      SpO2 10/23/22 0747 97 %     Weight 10/23/22 0746 167 lb (75.8 kg)     Height 10/23/22 0746 5\' 5"  (1.651 m)     Head Circumference --      Peak Flow --      Pain Score 10/23/22 0746 10     Pain Loc --      Pain Education --      Exclude from Growth Chart --     Most recent vital signs: Vitals:   10/23/22 0747  BP: 139/88  Pulse: 83  Resp: 18  Temp: 98.3 F (36.8 C)  SpO2: 97%   General: Awake, alert, oriented. CV:  Good peripheral perfusion.  Resp:  Normal effort.  Abd:  No distention.  Other:  Left leg DP 2+. Left thigh without pain with palpation   ED Results / Procedures / Treatments   Labs (all labs ordered are listed, but only abnormal results are displayed) Labs Reviewed - No data to display   EKG  None   RADIOLOGY None  PROCEDURES:  Critical Care performed: No   MEDICATIONS ORDERED IN ED: Medications - No data to  display   IMPRESSION / MDM / ASSESSMENT AND PLAN / ED COURSE  I reviewed the triage vital signs and the nursing notes.                              Differential diagnosis includes, but is not limited to, bursitis, IT band inflammation, myositis  Patient's presentation is most consistent with acute presentation with potential threat to life or bodily function.   Patient presented to the emergency department today with concerns for left thigh pain.  Given patient's description of pain I do have concern for I T band inflammation.  Think this could be due to patient's altered gait wearing the boot.  Discussed this with the patient.  At this time I have low concern for blood clot or myositis.  Will give patient rehab information.  Will plan on discharging and will give orthopedic follow-up information.     FINAL CLINICAL IMPRESSION(S) / ED DIAGNOSES  Final diagnoses:  Left leg pain  It band syndrome, left      Note:  This document was prepared using Dragon voice recognition software and may include unintentional dictation errors.    Phineas Semen, MD 10/23/22 [phone]

## 2022-10-23 NOTE — ED Triage Notes (Signed)
Pt comes with c/o left leg pain. Pt states she had boot on for awhile. Pt states it just came off a few days ago after wearing for 9 weeks. Pt states it is throbbing and painful. Pt states it is very painful and radiates up to her hip bone.

## 2022-11-09 ENCOUNTER — Other Ambulatory Visit: Payer: Self-pay | Admitting: Nurse Practitioner

## 2022-11-09 NOTE — Telephone Encounter (Signed)
Requested Prescriptions  Pending Prescriptions Disp Refills   metFORMIN (GLUCOPHAGE) 500 MG tablet [Pharmacy Med Name: METFORMIN HCL 500 MG TABLET] 60 tablet 2    Sig: TAKE 1 TABLET BY MOUTH 2 TIMES DAILY WITH A MEAL.     Endocrinology:  Diabetes - Biguanides Failed - 11/09/2022  2:10 AM      Failed - B12 Level in normal range and within 720 days    No results found for: "VITAMINB12"       Passed - Cr in normal range and within 360 days    Creatinine  Date Value Ref Range Status  12/13/2011 0.70 0.60 - 1.30 mg/dL Final   Creatinine, Ser  Date Value Ref Range Status  09/13/2022 0.71 0.44 - 1.00 mg/dL Final         Passed - HBA1C is between 0 and 7.9 and within 180 days    Hgb A1c MFr Bld  Date Value Ref Range Status  08/02/2022 5.6 4.8 - 5.6 % Final    Comment:             Prediabetes: 5.7 - 6.4          Diabetes: >6.4          Glycemic control for adults with diabetes: <7.0          Passed - eGFR in normal range and within 360 days    EGFR (African American)  Date Value Ref Range Status  12/13/2011 >60  Final   GFR calc Af Amer  Date Value Ref Range Status  10/02/2016 >60 >60 mL/min Final    Comment:    (NOTE) The eGFR has been calculated using the CKD EPI equation. This calculation has not been validated in all clinical situations. eGFR's persistently <60 mL/min signify possible Chronic Kidney Disease.    EGFR (Non-African Amer.)  Date Value Ref Range Status  12/13/2011 >60  Final    Comment:    eGFR values <60mL/min/1.73 m2 may be an indication of chronic kidney disease (CKD). Calculated eGFR is useful in patients with stable renal function. The eGFR calculation will not be reliable in acutely ill patients when serum creatinine is changing rapidly. It is not useful in  patients on dialysis. The eGFR calculation may not be applicable to patients at the low and high extremes of body sizes, pregnant women, and vegetarians.    GFR, Estimated  Date Value Ref  Range Status  09/13/2022 >60 >60 mL/min Final    Comment:    (NOTE) Calculated using the CKD-EPI Creatinine Equation (2021)    eGFR  Date Value Ref Range Status  08/02/2022 93 >59 mL/min/1.73 Final         Passed - Valid encounter within last 6 months    Recent Outpatient Visits           2 weeks ago Urinary tract infection symptoms   [place] [facility] [place], Sherran Needs, NP   1 month ago Acute cystitis with hematuria   [place] [facility] [place], Ball[place] T, NP   3 months ago Essential hypertension   [place] [facility] [place], Hayden T, NP   9 months ago Lab test positive for detection of COVID-19 virus   [place] [facility] [place], Corrie Dandy T, NP   1 year ago Essential hypertension   [place] [facility] [place], Dorie Rank, NP       Future Appointments  In 1 week [place], Dorie Rank, NP [place] [facility]   In 3 months Ruth, [place] T, NP [place] [facility]            Passed - CBC within normal limits and completed in the last 12 months    WBC  Date Value Ref Range Status  09/13/2022 5.8 4.0 - 10.5 K/uL Final   RBC  Date Value Ref Range Status  09/13/2022 4.37 3.87 - 5.11 MIL/uL Final   Hemoglobin  Date Value Ref Range Status  09/13/2022 12.7 12.0 - 15.0 g/dL Final  29/56/2130 86.5 11.1 - 15.9 g/dL Final   HCT  Date Value Ref Range Status  09/13/2022 38.4 36.0 - 46.0 % Final   Hematocrit  Date Value Ref Range Status  08/02/2022 44.6 34.0 - 46.6 % Final   MCHC  Date Value Ref Range Status  09/13/2022 33.1 30.0 - 36.0 g/dL Final   [facility]  Date Value Ref Range Status  09/13/2022 29.1 26.0 - 34.0 pg Final   MCV  Date Value Ref Range Status  09/13/2022 87.9 80.0 - 100.0 fL Final  08/02/2022 88 79 - 97 fL Final  12/13/2011 76 (L) 80 - 100 fL Final   No results found for: "PLTCOUNTKUC", "LABPLAT",  "POCPLA" RDW  Date Value Ref Range Status  09/13/2022 12.3 11.5 - 15.5 % Final  08/02/2022 12.4 11.7 - 15.4 % Final  12/13/2011 15.1 (H) 11.5 - 14.5 % Final

## 2022-11-13 ENCOUNTER — Ambulatory Visit: Payer: Self-pay

## 2022-11-13 ENCOUNTER — Telehealth (INDEPENDENT_AMBULATORY_CARE_PROVIDER_SITE_OTHER): Payer: 59 | Admitting: Family Medicine

## 2022-11-13 ENCOUNTER — Encounter: Payer: Self-pay | Admitting: Nurse Practitioner

## 2022-11-13 DIAGNOSIS — J209 Acute bronchitis, unspecified: Secondary | ICD-10-CM | POA: Diagnosis not present

## 2022-11-13 MED ORDER — HYDROCOD POLI-CHLORPHE POLI ER 10-8 MG/5ML PO SUER: 5.0000 mL | Freq: Every evening | ORAL | 0 refills | Status: DC | PRN

## 2022-11-13 MED ORDER — PREDNISONE 20 MG PO TABS
40.0000 mg | ORAL_TABLET | Freq: Every day | ORAL | 0 refills | Status: AC
Start: 2022-11-13 — End: 2022-11-18

## 2022-11-13 MED ORDER — ALBUTEROL SULFATE HFA 108 (90 BASE) MCG/ACT IN AERS
2.0000 | INHALATION_SPRAY | RESPIRATORY_TRACT | 4 refills | Status: AC | PRN
Start: 2022-11-13 — End: ?

## 2022-11-13 MED ORDER — BENZONATATE 100 MG PO CAPS
100.0000 mg | ORAL_CAPSULE | Freq: Three times a day (TID) | ORAL | 0 refills | Status: DC | PRN
Start: 2022-11-13 — End: 2022-11-19

## 2022-11-13 NOTE — Telephone Encounter (Signed)
Attempted to reach pt, LVM to call office back to schedule an appointment for concerns.  Put in CRM.

## 2022-11-13 NOTE — Telephone Encounter (Signed)
  Chief Complaint: Cough - near constant - may have blood in it Symptoms: above Frequency: since Saturday Pertinent Negatives: Patient denies  Disposition: [] ED /[] Urgent Care (no appt availability in office) / [] Appointment(In office/virtual)/ []  [place] [facility]/ [] Home Care/ [] Refused Recommended Disposition /[] [place] [facility]/ []  Follow-up with PCP Additional Notes: Pt states there is some red in sputum - also green/yellow. Cough sounds dry, but pt states that it is wet at night. Coughing so much chest hurts.    Summary: cough / rx req   The patient shares that they have developed a cough starting 11/10/22  The patient would like to be prescribed cough medicine to help with their discomfort  Please contact further when possible     Reason for Disposition  [1] MILD difficulty breathing (e.g., minimal/no SOB at rest, SOB with walking, pulse <100) AND [2] still present when not coughing  Answer Assessment - Initial Assessment Questions 1. ONSET: "When did the cough begin?"      Thursday 2. SEVERITY: "How bad is the cough today?"      Moderate 3. SPUTUM: "Describe the color of your sputum" (none, dry cough; clear, white, yellow, green)     Yellow and green  - now  4. HEMOPTYSIS: "Are you coughing up any blood?" If so ask: "How much?" (flecks, streaks, tablespoons, etc.)     yes 5. DIFFICULTY BREATHING: "Are you having difficulty breathing?" If Yes, ask: "How bad is it?" (e.g., mild, moderate, severe)    - MILD: No SOB at rest, mild SOB with walking, speaks normally in sentences, can lie down, no retractions, pulse < 100.    - MODERATE: SOB at rest, SOB with minimal exertion and prefers to sit, cannot lie down flat, speaks in phrases, mild retractions, audible wheezing, pulse 100-120.    - SEVERE: Very SOB at rest, speaks in single words, struggling to breathe, sitting hunched forward, retractions, pulse > 120      moderate 6. FEVER: "Do you have a fever?" If Yes, ask:  "What is your temperature, how was it measured, and when did it start?"     no 7. CARDIAC HISTORY: "Do you have any history of heart disease?" (e.g., heart attack, congestive heart failure)      no 8. LUNG HISTORY: "Do you have any history of lung disease?"  (e.g., pulmonary embolus, asthma, emphysema)     no 9. PE RISK FACTORS: "Do you have a history of blood clots?" (or: recent major surgery, recent prolonged travel, bedridden)     no 10. OTHER SYMPTOMS: "Do you have any other symptoms?" (e.g., runny nose, wheezing, chest pain)       Runny nose  Protocols used: Cough - Acute Non-Productive-A-AH

## 2022-11-13 NOTE — Telephone Encounter (Signed)
Patient needs an appointment for evaluation. Please call to schedule.

## 2022-11-13 NOTE — Progress Notes (Signed)
Name: Christine Chase   MRN: [id number]    DOB: 19-Nov-1965   Date:11/13/2022       Progress Note  Subjective:    Chief Complaint  Chief Complaint  Patient presents with   Cough    4 days ago, home COVID test -Negative 11/11/22, constant dry cough   Sore Throat    drainage    I connected with  [name]  on 11/13/22 at  2:40 PM EST by a video enabled telemedicine application and verified that I am speaking with the correct person using two identifiers.  I discussed the limitations of evaluation and management by telemedicine and the availability of in person appointments. The patient expressed understanding and agreed to proceed. Staff also discussed with the patient that there may be a patient responsible charge related to this service. Patient Location: home Provider Location: [facility] clinic  Additional Individuals present:   Cough Pertinent negatives include no chest pain, chills, shortness of breath or wheezing.  Sore Throat  Associated symptoms include congestion and coughing. Pertinent negatives include no shortness of breath.   Connected via video on 2:50 - which quickly disconnected, I connected again and [name] was signed in with video and audio muted Calling pt from office phone-  Cough "is horrible" productive mucous, this am some of sputum was red (not sure if its from her cough syrup) Sat 100.1 other than that no fever, no sweats, hot/cold chills Hx of bronchitis in the past needing steroids, cough meds, inhalers Finished a whole bottle of cough meds at home and it didn't help     Patient Active Problem List   Diagnosis Date Noted   Anticipatory grief 10/20/2022   Bacterial vaginosis 09/12/2022   Acute cystitis with hematuria 09/12/2022   BMI 28.0-28.9,adult 08/02/2022   Acute left ankle pain 08/02/2022   Elevated low density lipoprotein (LDL) cholesterol level 06/11/2020   Essential hypertension 05/20/2020   Lumbar degenerative disc disease 05/20/2020    IFG (impaired fasting glucose) 05/15/2020   History of DVT (deep vein thrombosis) 05/23/2016    Social History   Tobacco Use   Smoking status: Never   Smokeless tobacco: Never  Substance Use Topics   Alcohol use: No     Current Outpatient Medications:    cyclobenzaprine (FLEXERIL) 10 MG tablet, TAKE 1 TABLET BY MOUTH THREE TIMES A DAY AS NEEDED FOR MUSCLE SPASM, Disp: 90 tablet, Rfl: 2   diclofenac Sodium (VOLTAREN) 1 % GEL, Apply 2 g topically 4 (four) times daily. Rub into affected area of foot 2 to 4 times daily, Disp: 100 g, Rfl: 2   ibuprofen (ADVIL) 800 MG tablet, Take 1 tablet (800 mg total) by mouth every 8 (eight) hours as needed., Disp: 90 tablet, Rfl: 0   lisinopril (ZESTRIL) 20 MG tablet, Take 1 tablet (20 mg total) by mouth daily., Disp: 90 tablet, Rfl: 2   LORazepam (ATIVAN) 0.5 MG tablet, Take 1 tablet (0.5 mg total) by mouth daily as needed for anxiety., Disp: 30 tablet, Rfl: 1   metFORMIN (GLUCOPHAGE) 500 MG tablet, TAKE 1 TABLET BY MOUTH 2 TIMES DAILY WITH A MEAL., Disp: 60 tablet, Rfl: 2  No Known Allergies  I personally reviewed active problem list, medication list, allergies, family history, social history, health maintenance, notes from last encounter, lab results, imaging with the patient/caregiver today.   Review of Systems  Constitutional: Negative.  Negative for chills.  HENT:  Positive for congestion. Negative for voice change.   Eyes: Negative.  Respiratory:  Positive for cough. Negative for shortness of breath and wheezing.   Cardiovascular: Negative.  Negative for chest pain.  Gastrointestinal: Negative.   Endocrine: Negative.   Genitourinary: Negative.   Musculoskeletal: Negative.   Skin: Negative.   Allergic/Immunologic: Negative.   Neurological: Negative.   Hematological: Negative.   Psychiatric/Behavioral: Negative.    All other systems reviewed and are negative.     Objective:   Virtual encounter, vitals limited, only able to obtain  the following There were no vitals filed for this visit. There is no height or weight on file to calculate BMI. Nursing Note and Vital Signs reviewed.  Physical Exam Vitals and nursing note reviewed.  Constitutional:      General: She is not in acute distress.    Appearance: She is not ill-appearing, toxic-appearing or diaphoretic.  Pulmonary:     Effort: No respiratory distress.     Comments: Intermittent coughing, able to speak in full sentences, no audible wheeze or stridor, no retractions observed  Neurological:     Mental Status: She is alert.     PE limited by virtual encounter  No results found for this or any previous visit (from the past 72 hour(s)).  Assessment and Plan:   1. 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 Encouraged her to retest, supportive and sx tx - encouraged meds for sinus drainage (claritin/nose sprays) OTC cough meds, she requests stronger cough syrup for at bedtime - not sleeping at all, encouraged her to start steroids tomorrow morning, do inhaler as needed with bronchospasms and coughing fits, she can continue mucinex DM OTC Encouraged to stay out of work if she develops fever/sweats/body aches or continues to have uncontrollable cough - work note provided F/up if not gradually improving in 1-3 weeks, hope bronchospasms will improve in 2-3 d with meds, other sx likely to slowly improve   - predniSONE (DELTASONE) 20 MG tablet; Take 2 tablets (40 mg total) by mouth daily with breakfast for 5 days.  Dispense: 10 tablet; Refill: 0 - albuterol (VENTOLIN HFA) 108 (90 Base) MCG/ACT inhaler; Inhale 2 puffs into the lungs every 4 (four) hours as needed  for wheezing or shortness of breath.  Dispense: 1 each; Refill: 4 - benzonatate (TESSALON) 100 MG capsule; Take 1 capsule (100 mg total) by mouth 3 (three) times daily as needed for cough.  Dispense: 30 capsule; Refill: 0 - chlorpheniramine-HYDROcodone (TUSSIONEX) 10-8 MG/5ML; Take 5 mLs by mouth at bedtime as needed for cough.  Dispense: 120 mL; Refill: 0    -Red flags and when to present for emergency care or RTC including fever >101.19F, chest pain, shortness of breath, new/worsening/un-resolving symptoms, reviewed with patient at time of visit. Follow up and care instructions discussed and provided in AVS. - I discussed the assessment and treatment plan with the patient. The patient was provided an opportunity to ask questions and all were answered. The patient agreed with the plan and demonstrated an understanding of the instructions.  I provided 20+ minutes of non-face-to-face time during this encounter.  Danelle Berry, PA-C 11/13/22 2:50 PM

## 2022-11-21 ENCOUNTER — Ambulatory Visit: Payer: 59 | Admitting: Nurse Practitioner

## 2022-12-22 ENCOUNTER — Encounter: Payer: Self-pay | Admitting: Nurse Practitioner

## 2022-12-22 NOTE — Telephone Encounter (Signed)
 Attempted to reach patient, LVM to call office back to schedule appointment.  Will put in CRM.

## 2023-01-12 NOTE — Patient Instructions (Signed)

## 2023-01-16 ENCOUNTER — Encounter: Payer: Self-pay | Admitting: Nurse Practitioner

## 2023-01-16 ENCOUNTER — Ambulatory Visit (INDEPENDENT_AMBULATORY_CARE_PROVIDER_SITE_OTHER): Payer: 59 | Admitting: Nurse Practitioner

## 2023-01-16 VITALS — BP 120/83 | HR 73 | Temp 98.0°F | Ht 65.0 in | Wt 176.6 lb

## 2023-01-16 DIAGNOSIS — R7301 Impaired fasting glucose: Secondary | ICD-10-CM | POA: Diagnosis not present

## 2023-01-16 DIAGNOSIS — I10 Essential (primary) hypertension: Secondary | ICD-10-CM | POA: Diagnosis not present

## 2023-01-16 DIAGNOSIS — E7800 Pure hypercholesterolemia, unspecified: Secondary | ICD-10-CM

## 2023-01-16 DIAGNOSIS — Z6828 Body mass index (BMI) 28.0-28.9, adult: Secondary | ICD-10-CM

## 2023-01-16 DIAGNOSIS — F4320 Adjustment disorder, unspecified: Secondary | ICD-10-CM

## 2023-01-16 MED ORDER — LISINOPRIL 20 MG PO TABS: 20.0000 mg | ORAL_TABLET | Freq: Every day | ORAL | 4 refills | Status: DC

## 2023-01-16 MED ORDER — METFORMIN HCL 500 MG PO TABS: 500.0000 mg | ORAL_TABLET | Freq: Two times a day (BID) | ORAL | 2 refills | Status: DC

## 2023-01-16 MED ORDER — LORAZEPAM 0.5 MG PO TABS: 0.5000 mg | ORAL_TABLET | Freq: Every day | ORAL | 1 refills | Status: DC | PRN

## 2023-01-16 NOTE — Assessment & Plan Note (Signed)
 BMI 29.39.  She would like to lose 30 pounds.  Will continue Metformin  500 MG BID due to IFG in past and weight loss desire. Educated her on this and side effects.  Could also consider Contrave or Topamax in future. Her insurance told her she needs to try 3 other medications and fail before she can get injectables.  Recommended eating smaller high protein, low fat meals more frequently and exercising 30 mins a day 5 times a week with a goal of 10-15lb weight loss in the next 3 months. Patient voiced their understanding and motivation to adhere to these recommendations.

## 2023-01-16 NOTE — Assessment & Plan Note (Signed)
 Chronic, stable.  BP at goal in office and at home.  Recommend she monitor BP at least a few mornings a week at home and document.  DASH diet at home.  Continue current medication regimen and adjust as needed, refills sent.  LABS: next visit.  Urine ALB 08 August 2022, continue ACE for kidney protection.

## 2023-01-16 NOTE — Assessment & Plan Note (Signed)
 Noted on past labs with glucose 114 to 120, but last A1c 5.6%.  Check A1c next visit.  Will continue Metformin to assist with weight loss, would like to lose 30 pounds.  Educated her on this medication and side effects.

## 2023-01-16 NOTE — Progress Notes (Signed)
 BP 120/83   Pulse 73   Temp 98 F (36.7 C) (Oral)   Ht 5' 5 (1.651 m)   Wt 176 lb 9.6 oz (80.1 kg)   LMP 08/24/2018   SpO2 97%   BMI 29.39 kg/m    Subjective:    Patient ID: Christine Chase, female    DOB: 1965/04/10, 58 y.o.   MRN: [id number]  HPI: Christine Chase is a 58 y.o. female  Chief Complaint  Patient presents with   Hypertension   IFG   Anxiety   HYPERTENSION Is still taking Lisinopril  20 MG daily.  Has had no ADR with this.   Hypertension status: stable  Satisfied with current treatment? yes Duration of hypertension: chronic BP monitoring frequency: occasional BP range:  BP medication side effects:  no Medication compliance: good compliance Previous BP meds: as above Aspirin: no Recurrent headaches: no, only if BP trends up Visual changes: no Palpitations: no Dyspnea: no Chest pain: no Lower extremity edema: no Dizzy/lightheaded: no  The 10-year ASCVD risk score (Arnett DK, et al., 2019) is: 3.3%   Values used to calculate the score:     Age: 26 years     Sex: Female     Is Non-Hispanic African American: No     Diabetic: No     Tobacco smoker: No     Systolic Blood Pressure: 120 mmHg     Is BP treated: Yes     HDL Cholesterol: 46 mg/dL     Total Cholesterol: 220 mg/dL  Impaired Fasting Glucose Taking Metformin  500 MG BID. Has improved A1c and weight decreased initially. HbA1C:  Lab Results  Component Value Date   HGBA1C 5.6 08/02/2022  Duration of elevated blood sugar:  Polydipsia: no Polyuria: no Weight change: no Visual disturbance: no Glucose Monitoring: no    Accucheck frequency: Not Checking    Fasting glucose:     Post prandial:  Diabetic Education: Not Completed Family history of diabetes: yes   DEPRESSION No current medications.  Mom was diagnosed with cancer recently, tumor in back -- incurable.  Having some stressors with this.  Was given Ativan  by another provider in office in October, which offers some benefit -- she  uses sparsely. PDMP last fill 12/23/22. Mood status: exacerbated Satisfied with current treatment?: yes Symptom severity: moderate  Duration of current treatment : chronic Side effects: no Medication compliance: good compliance Depressed mood: no Anxious mood: yes Anhedonia: no Significant weight loss or gain: no Insomnia: none Fatigue: no Feelings of worthlessness or guilt: no Impaired concentration/indecisiveness: no Suicidal ideations: no Hopelessness: no Crying spells: yes    01/16/2023    2:55 PM 11/13/2022    2:40 PM 10/20/2022   10:27 AM 09/12/2022    1:52 PM 01/26/2022    8:04 AM  Depression screen PHQ 2/9  Decreased Interest 0 0 0 1 0  Down, Depressed, Hopeless 0 0 1 0 0  PHQ - 2 Score 0 0 1 1 0  Altered sleeping 0 0 1 0 1  Tired, decreased energy 0 0 0 1 0  Change in appetite 0 0 0 1 0  Feeling bad or failure about yourself  0 0 0 0 0  Trouble concentrating 0 0 0 0 0  Moving slowly or fidgety/restless 0 0 0 0 0  Suicidal thoughts 0 0 0 0 0  PHQ-9 Score 0 0 2 3 1   Difficult doing work/chores Not difficult at all Not difficult at all Not difficult at all  01/16/2023    2:56 PM 10/20/2022   10:27 AM 09/12/2022    1:52 PM 01/26/2022    8:05 AM  GAD 7 : Generalized Anxiety Score  Nervous, Anxious, on Edge 0 1 0 0  Control/stop worrying 1 1 0 0  Worry too much - different things 1 0 0 0  Trouble relaxing 1 1 0 0  Restless 0 1 0 0  Easily annoyed or irritable 0 0 0 1  Afraid - awful might happen 1 0 0 0  Total GAD 7 Score 4 4 0 1  Anxiety Difficulty Not difficult at all Not difficult at all Not difficult at all Not difficult at all   Relevant past medical, surgical, family and social history reviewed and updated as indicated. Interim medical history since our last visit reviewed. Allergies and medications reviewed and updated.  Review of Systems  Constitutional:  Negative for activity change, appetite change, diaphoresis, fatigue and fever.  Respiratory:   Negative for cough, chest tightness, shortness of breath and wheezing.   Cardiovascular:  Negative for chest pain, palpitations and leg swelling.  Gastrointestinal: Negative.   Endocrine: Negative for polydipsia, polyphagia and polyuria.  Neurological: Negative.   Psychiatric/Behavioral:  Negative for decreased concentration, self-injury, sleep disturbance and suicidal ideas. The patient is nervous/anxious.     Per HPI unless specifically indicated above     Objective:    BP 120/83   Pulse 73   Temp 98 F (36.7 C) (Oral)   Ht 5' 5 (1.651 m)   Wt 176 lb 9.6 oz (80.1 kg)   LMP 08/24/2018   SpO2 97%   BMI 29.39 kg/m   Wt Readings from Last 3 Encounters:  01/16/23 176 lb 9.6 oz (80.1 kg)  10/23/22 167 lb (75.8 kg)  10/20/22 178 lb 6.4 oz (80.9 kg)    Physical Exam Vitals and nursing note reviewed.  Constitutional:      General: She is awake. She is not in acute distress.    Appearance: She is well-developed and well-groomed. She is obese. She is not ill-appearing or toxic-appearing.  HENT:     Head: Normocephalic.     Right Ear: Hearing and external ear normal.     Left Ear: Hearing and external ear normal.  Eyes:     General: Lids are normal.        Right eye: No discharge.        Left eye: No discharge.     Conjunctiva/sclera: Conjunctivae normal.     Pupils: Pupils are equal, round, and reactive to light.  Neck:     Thyroid: No thyromegaly.     Vascular: No carotid bruit.  Cardiovascular:     Rate and Rhythm: Normal rate and regular rhythm.     Heart sounds: Normal heart sounds. No murmur heard.    No gallop.  Pulmonary:     Effort: Pulmonary effort is normal. No accessory muscle usage or respiratory distress.     Breath sounds: Normal breath sounds.  Abdominal:     General: Bowel sounds are normal. There is no distension.     Palpations: Abdomen is soft.     Tenderness: There is no abdominal tenderness.  Musculoskeletal:     Cervical back: Normal range of  motion and neck supple.     Right lower leg: No edema.     Left lower leg: No edema.  Lymphadenopathy:     Cervical: No cervical adenopathy.  Skin:    General: Skin is  warm and dry.  Neurological:     Mental Status: She is alert and oriented to person, place, and time.     Deep Tendon Reflexes: Reflexes are normal and symmetric.     Reflex Scores:      Brachioradialis reflexes are 2+ on the right side and 2+ on the left side.      Patellar reflexes are 2+ on the right side and 2+ on the left side. Psychiatric:        Attention and Perception: Attention normal.        Mood and Affect: Mood normal.        Speech: Speech normal.        Behavior: Behavior normal. Behavior is cooperative.        Thought Content: Thought content normal.    Results for orders placed or performed in visit on 10/20/22  Urine Culture   Collection Time: 10/20/22  9:34 AM   Specimen: Urine   UR  Result Value Ref Range   Urine Culture, Routine Final report    Organism ID, Bacteria Comment   Urinalysis, Routine w reflex microscopic   Collection Time: 10/20/22  9:34 AM  Result Value Ref Range   Specific Gravity, UA >1.030 (H) 1.005 - 1.030   pH, UA 5.5 5.0 - 7.5   Color, UA Yellow Yellow   Appearance Ur Clear Clear   Leukocytes,UA Negative Negative   Protein,UA Trace (A) Negative/Trace   Glucose, UA Negative Negative   Ketones, UA Negative Negative   RBC, UA Negative Negative   Bilirubin, UA Negative Negative   Urobilinogen, Ur 0.2 0.2 - 1.0 mg/dL   Nitrite, UA Negative Negative   Microscopic Examination Comment       Assessment & Plan:   Problem List Items Addressed This Visit       Cardiovascular and Mediastinum   Essential hypertension - Primary   Chronic, stable.  BP at goal in office and at home.  Recommend she monitor BP at least a few mornings a week at home and document.  DASH diet at home.  Continue current medication regimen and adjust as needed, refills sent.  LABS: next visit.   Urine ALB 08 August 2022, continue ACE for kidney protection.          Relevant Medications   lisinopril  (ZESTRIL ) 20 MG tablet     Endocrine   IFG (impaired fasting glucose)   Noted on past labs with glucose 114 to 120, but last A1c 5.6%.  Check A1c next visit.  Will continue Metformin  to assist with weight loss, would like to lose 30 pounds.  Educated her on this medication and side effects.        Other   Anticipatory grief   Exacerbated due to her mother's terminal cancer diagnosis.  Will maintain Ativan  PRN for now, however she is aware to use sparsely for severe anxiety only and that this is not for long term use.  30 pills should last at least 3 months.  Discussed at length with patient.  Refills sent.      BMI 28.0-28.9,adult   BMI 29.39.  She would like to lose 30 pounds.  Will continue Metformin  500 MG BID due to IFG in past and weight loss desire. Educated her on this and side effects.  Could also consider Contrave or Topamax in future. Her insurance told her she needs to try 3 other medications and fail before she can get injectables.  Recommended eating smaller high protein,  low fat meals more frequently and exercising 30 mins a day 5 times a week with a goal of 10-15lb weight loss in the next 3 months. Patient voiced their understanding and motivation to adhere to these recommendations.         Follow up plan: Return in about 7 months (around 08/03/2023) for Annual Physical -- after 08/02/23 -- with pap.

## 2023-01-16 NOTE — Assessment & Plan Note (Signed)
 Exacerbated due to her mother's terminal cancer diagnosis.  Will maintain Ativan  PRN for now, however she is aware to use sparsely for severe anxiety only and that this is not for long term use.  30 pills should last at least 3 months.  Discussed at length with patient.  Refills sent.

## 2023-02-06 ENCOUNTER — Telehealth (INDEPENDENT_AMBULATORY_CARE_PROVIDER_SITE_OTHER): Payer: Self-pay | Admitting: Family Medicine

## 2023-02-06 DIAGNOSIS — J069 Acute upper respiratory infection, unspecified: Secondary | ICD-10-CM

## 2023-02-06 MED ORDER — PREDNISONE 20 MG PO TABS
40.0000 mg | ORAL_TABLET | Freq: Every day | ORAL | 0 refills | Status: DC
Start: 2023-02-06 — End: 2023-05-01

## 2023-02-06 MED ORDER — BENZONATATE 100 MG PO CAPS: 100.0000 mg | ORAL_CAPSULE | Freq: Three times a day (TID) | ORAL | 0 refills | Status: DC | PRN

## 2023-02-06 NOTE — Progress Notes (Signed)
Name: Christine Chase   MRN: [id number]    DOB: 10-05-1965   Date:02/06/2023       Progress Note  Subjective:    Chief Complaint  Chief Complaint  Patient presents with   URI    Started yesterday. Took OTC meds last night   Cough    All night, non-productive.    I connected with  Christine Chase  on 02/06/23 at  4:00 PM EST by a video enabled telemedicine application and verified that I am speaking with the correct person using two identifiers.  I discussed the limitations of evaluation and management by telemedicine and the availability of in person appointments. The patient expressed understanding and agreed to proceed. Staff also discussed with the patient that there may be a patient responsible charge related to this service. Patient Location:  home Provider Location: [facility]  Additional Individuals present: none  Got sick yesterday with coughing and aching, feels like "something is sitting on her chest in there" coughing like she needs to cough something up, but she can't, nonproductive, pain with deep inspiration, no CP, wheeze, SOB, fever sweats, HA She has taken Cold and flu tabs - daytime and nighttime -  Tylenol, DM, and then two other ingredients  She took old tussionex which helped her last night She has not tried inhaler    Patient Active Problem List   Diagnosis Date Noted   Anticipatory grief 10/20/2022   BMI 28.0-28.9,adult 08/02/2022   Elevated low density lipoprotein (LDL) cholesterol level 06/11/2020   Essential hypertension 05/20/2020   Lumbar degenerative disc disease 05/20/2020   IFG (impaired fasting glucose) 05/15/2020   History of DVT (deep vein thrombosis) 05/23/2016    Social History   Tobacco Use   Smoking status: Never   Smokeless tobacco: Never  Substance Use Topics   Alcohol use: No     Current Outpatient Medications:    albuterol (VENTOLIN HFA) 108 (90 Base) MCG/ACT inhaler, Inhale 2 puffs into the lungs every 4 (four) hours as needed  for wheezing or shortness of breath., Disp: 1 each, Rfl: 4   cyclobenzaprine (FLEXERIL) 10 MG tablet, TAKE 1 TABLET BY MOUTH THREE TIMES A DAY AS NEEDED FOR MUSCLE SPASM, Disp: 90 tablet, Rfl: 2   diclofenac Sodium (VOLTAREN) 1 % GEL, Apply 2 g topically 4 (four) times daily. Rub into affected area of foot 2 to 4 times daily, Disp: 100 g, Rfl: 2   ibuprofen (ADVIL) 800 MG tablet, Take 1 tablet (800 mg total) by mouth every 8 (eight) hours as needed., Disp: 90 tablet, Rfl: 0   lisinopril (ZESTRIL) 20 MG tablet, Take 1 tablet (20 mg total) by mouth daily., Disp: 90 tablet, Rfl: 4   LORazepam (ATIVAN) 0.5 MG tablet, Take 1 tablet (0.5 mg total) by mouth daily as needed for anxiety (Use sparsely and only as needed for severe anxiety during this time.  Not for long term use.)., Disp: 30 tablet, Rfl: 1   metFORMIN (GLUCOPHAGE) 500 MG tablet, Take 1 tablet (500 mg total) by mouth 2 (two) times daily with a meal., Disp: 180 tablet, Rfl: 2  No Known Allergies  I personally reviewed active problem list, medication list, allergies, family history, social history, health maintenance, notes from last encounter, lab results, imaging with the patient/caregiver today.   Review of Systems  Constitutional: Negative.   HENT: Negative.    Eyes: Negative.   Respiratory:  Positive for cough.   Cardiovascular: Negative.   Gastrointestinal: Negative.   Endocrine: Negative.  Genitourinary: Negative.   Musculoskeletal: Negative.   Skin: Negative.   Allergic/Immunologic: Negative.   Neurological: Negative.   Hematological: Negative.   Psychiatric/Behavioral: Negative.    All other systems reviewed and are negative.     Objective:   Virtual encounter, vitals limited, only able to obtain the following There were no vitals filed for this visit. There is no height or weight on file to calculate BMI. Nursing Note and Vital Signs reviewed.  Physical Exam Vitals and nursing note reviewed.  Constitutional:       General: She is not in acute distress.    Appearance: She is not ill-appearing, toxic-appearing or diaphoretic.  Pulmonary:     Effort: No tachypnea, accessory muscle usage, respiratory distress or retractions.     Comments: No audible wheeze or stridor, speaking in full and complete sentences, no coughing during virtual encounter Neurological:     Mental Status: She is alert.     PE limited by virtual encounter  No results found for this or any previous visit (from the past 72 hours).  Assessment and Plan:     ICD-10-CM   1. Upper respiratory tract infection, unspecified type  J06.9 predniSONE (DELTASONE) 20 MG tablet     Encouraged her to use her inhaler, mucinex, and she can still use her current cold/cough med - explained the active ingredients that have some effect ans what does not have much symptomatic effect. She asked me to prescribe whatever will make her get over this the fastest, which I explained there is no prescription that does that with a likely viral respiratory illness - it has to run its course and she should support her body with fluids/rest/she can try zinc/vit c/airborne type supplements to support immune system but overall virus should be self limiting  She is without distress on virtual exam today Did have some coughing Hx of needing steroids, inhalers and cough meds for bronchitis - last episode was last Nov +/- on steroids  - she is not having wheeze or SOB right now and last acute bronchitis it doesn't sound like she used or needed the inhaler - so she can do steroids may help some sx but not likely to influence very much when she would be over this URI.   Encouraged to stay out of work if she develops fever/sweats/body aches or continues to have uncontrollable cough - work note provided F/up if not gradually improving in 1-3 weeks   -Red flags and when to present for emergency care or RTC including fever >101.71F, chest pain, shortness of breath,  new/worsening/un-resolving symptoms, reviewed with patient at time of visit. Follow up and care instructions discussed and provided in AVS. - I discussed the assessment and treatment plan with the patient. The patient was provided an opportunity to ask questions and all were answered. The patient agreed with the plan and demonstrated an understanding of the instructions.  I provided 18+ minutes of non-face-to-face time during this encounter.  Danelle Berry, PA-C 02/06/23 4:08 PM

## 2023-02-07 ENCOUNTER — Ambulatory Visit: Payer: 59 | Admitting: Nurse Practitioner

## 2023-02-15 ENCOUNTER — Encounter: Payer: Self-pay | Admitting: Nurse Practitioner

## 2023-02-16 ENCOUNTER — Encounter: Payer: Self-pay | Admitting: Nurse Practitioner

## 2023-02-16 ENCOUNTER — Telehealth (INDEPENDENT_AMBULATORY_CARE_PROVIDER_SITE_OTHER): Payer: Self-pay | Admitting: Nurse Practitioner

## 2023-02-16 DIAGNOSIS — H1032 Unspecified acute conjunctivitis, left eye: Secondary | ICD-10-CM

## 2023-02-16 DIAGNOSIS — H109 Unspecified conjunctivitis: Secondary | ICD-10-CM | POA: Insufficient documentation

## 2023-02-16 MED ORDER — HYDROCOD POLI-CHLORPHE POLI ER 10-8 MG/5ML PO SUER: 5.0000 mL | Freq: Every evening | ORAL | 0 refills | Status: DC | PRN

## 2023-02-16 MED ORDER — ERYTHROMYCIN 5 MG/GM OP OINT: 1.0000 | TOPICAL_OINTMENT | Freq: Every day | OPHTHALMIC | 0 refills | Status: DC

## 2023-02-16 NOTE — Patient Instructions (Signed)
 Bacterial Conjunctivitis, Adult  Bacterial conjunctivitis is an infection of your conjunctiva. This is the clear membrane that covers the white part of your eye and the inner part of your eyelid. This infection can make your eye:  Red or pink.  Itchy or irritated.  This condition spreads easily from person to person (is contagious) and from one eye to the other eye.  What are the causes?  This condition is caused by germs (bacteria). You may get the infection if you come into close contact with:  A person who has the infection.  Items that have germs on them (are contaminated), such as face towels, contact lens solution, or eye makeup.  What increases the risk?  You are more likely to get this condition if:  You have contact with people who have the infection.  You wear contact lenses.  You have a sinus infection.  You have had a recent eye injury or surgery.  You have a weak body defense system (immune system).  You have dry eyes.  What are the signs or symptoms?    Thick, yellowish discharge from the eye.  Tearing or watery eyes.  Itchy eyes.  Burning feeling in your eyes.  Eye redness.  Swollen eyelids.  Blurred vision.  How is this treated?    Antibiotic eye drops or ointment.  Antibiotic medicine taken by mouth. This is used for infections that do not get better with drops or ointment or that last more than 10 days.  Cool, wet cloths placed on the eyes.  Artificial tears used 2-6 times a day.  Follow these instructions at home:  Medicines  Take or apply your antibiotic medicine as told by your doctor. Do not stop using it even if you start to feel better.  Take or apply over-the-counter and prescription medicines only as told by your doctor.  Do not touch your eyelid with the eye-drop bottle or the ointment tube.  Managing discomfort  Wipe any fluid from your eye with a warm, wet washcloth or a cotton ball.  Place a clean, cool, wet cloth on your eye. Do this for 10-20 minutes, 3-4 times a day.  General  instructions  Do not wear contacts until the infection is gone. Wear glasses until your doctor says it is okay to wear contacts again.  Do not wear eye makeup until the infection is gone. Throw away old eye makeup.  Change or wash your pillowcase every day.  Do not share towels or washcloths.  Wash your hands often with soap and water for at least 20 seconds and especially before touching your face or eyes. Use paper towels to dry your hands.  Do not touch or rub your eyes.  Do not drive or use heavy machinery if your vision is blurred.  Contact a doctor if:  You have a fever.  You do not get better after 10 days.  Get help right away if:  You have a fever and your symptoms get worse all of a sudden.  You have very bad pain when you move your eye.  Your face:  Hurts.  Is red.  Is swollen.  You have sudden loss of vision.  Summary  Bacterial conjunctivitis is an infection of your conjunctiva.  This infection spreads easily from person to person.  Wash your hands often with soap and water for at least 20 seconds and especially before touching your face or eyes. Use paper towels to dry your hands.  Take or apply your  antibiotic medicine as told by your doctor.  Contact a doctor if you have a fever or you do not get better after 10 days.  This information is not intended to replace advice given to you by your health care provider. Make sure you discuss any questions you have with your health care provider.  Document Revised: 04/07/2020 Document Reviewed: 04/07/2020  Elsevier Patient Education  2024 [employer].

## 2023-02-16 NOTE — Progress Notes (Signed)
 LMP 08/24/2018    Subjective:    Patient ID: Christine Chase, female    DOB: [birth year], 58 y.o.   MRN: [id number]  HPI: Christine Chase is a 58 y.o. female  Chief Complaint  Patient presents with   URI    Feels as if getting worse, need eye drops called in for eyes   Virtual Visit via Video Note  I connected with Christine Chase on 02/16/23 at  3:00 PM EST by a video enabled telemedicine application and verified that I am speaking with the correct person using two identifiers.  Location: Patient: home Provider: work   I discussed the limitations of evaluation and management by telemedicine and the availability of in person appointments. The patient expressed understanding and agreed to proceed.  I discussed the assessment and treatment plan with the patient. The patient was provided an opportunity to ask questions and all were answered. The patient agreed with the plan and demonstrated an understanding of the instructions.   The patient was advised to call back or seek an in-person evaluation if the symptoms worsen or if the condition fails to improve as anticipated.  I provided 25 minutes of non-face-to-face time during this encounter.   Janifer Gieselman T Justine Dines, NP   EYE PAIN Started yesterday with matting to eye, exposed by students at school. Duration:  days Involved eye:  left Onset: sudden Severity: 8/10  Quality: dull and aching and itchy Foreign body sensation:no Visual impairment: yes -- blurry left eye Eye redness: yes Discharge: yes Crusting or matting of eyelids: yes Swelling: no Photophobia: no Itching: yes Tearing: yes Headache: yes for the last 3 days Floaters: no URI symptoms: yes last week had URI, cough is horrible now -- worsening, would like cough syrup Contact lens use: no Close contacts with similar problems: yes Eye trauma: no Aggravating factors: touching it Alleviating factors: eye drops recently Status: fluctuating Treatments attempted:  eye drops recently  Relevant past medical, surgical, family and social history reviewed and updated as indicated. Interim medical history since our last visit reviewed. Allergies and medications reviewed and updated.  Review of Systems  Constitutional:  Negative for activity change, appetite change, fatigue and fever.  Eyes:  Positive for pain, discharge, redness, itching and visual disturbance.  Respiratory:  Positive for cough. Negative for chest tightness, shortness of breath and wheezing.   Cardiovascular:  Negative for chest pain, palpitations and leg swelling.  Gastrointestinal: Negative.   Neurological: Negative.   Psychiatric/Behavioral: Negative.      Per HPI unless specifically indicated above     Objective:    LMP 08/24/2018   Wt Readings from Last 3 Encounters:  01/16/23 176 lb 9.6 oz (80.1 kg)  10/23/22 167 lb (75.8 kg)  10/20/22 178 lb 6.4 oz (80.9 kg)    Physical Exam Vitals and nursing note reviewed.  Constitutional:      General: She is awake. She is not in acute distress.    Appearance: She is well-developed. She is not ill-appearing.  HENT:     Head: Normocephalic.     Right Ear: Hearing normal.     Left Ear: Hearing normal.  Eyes:     General: Lids are normal.        Right eye: No discharge.        Left eye: Discharge present.    Conjunctiva/sclera:     Right eye: Right conjunctiva is not injected.     Left eye: Left conjunctiva is injected. No hemorrhage. Pulmonary:  Effort: Pulmonary effort is normal. No accessory muscle usage or respiratory distress.  Musculoskeletal:     Cervical back: Normal range of motion.  Neurological:     Mental Status: She is alert and oriented to person, place, and time.  Psychiatric:        Attention and Perception: Attention normal.        Mood and Affect: Mood normal.        Behavior: Behavior normal. Behavior is cooperative.        Thought Content: Thought content normal.        Judgment: Judgment normal.      Results for orders placed or performed in visit on 10/20/22  Urine Culture   Collection Time: 10/20/22  9:34 AM   Specimen: Urine   UR  Result Value Ref Range   Urine Culture, Routine Final report    Organism ID, Bacteria Comment   Urinalysis, Routine w reflex microscopic   Collection Time: 10/20/22  9:34 AM  Result Value Ref Range   Specific Gravity, UA >1.030 (H) 1.005 - 1.030   pH, UA 5.5 5.0 - 7.5   Color, UA Yellow Yellow   Appearance Ur Clear Clear   Leukocytes,UA Negative Negative   Protein,UA Trace (A) Negative/Trace   Glucose, UA Negative Negative   Ketones, UA Negative Negative   RBC, UA Negative Negative   Bilirubin, UA Negative Negative   Urobilinogen, Ur 0.2 0.2 - 1.0 mg/dL   Nitrite, UA Negative Negative   Microscopic Examination Comment       Assessment & Plan:   Problem List Items Addressed This Visit       Other   Conjunctivitis - Primary   Acute for 24 hours after exposure.  Will start Erythromycin  ointment to left eye and if right eye begins to have symptoms can place there.  Warm compresses and Tylenol  for comfort.  Cough syrup sent to assist with remaining cough present.        Follow up plan: Return if symptoms worsen or fail to improve.

## 2023-02-16 NOTE — Assessment & Plan Note (Signed)
 Acute for 24 hours after exposure.  Will start Erythromycin  ointment to left eye and if right eye begins to have symptoms can place there.  Warm compresses and Tylenol  for comfort.  Cough syrup sent to assist with remaining cough present.

## 2023-02-20 MED ORDER — AMOXICILLIN-POT CLAVULANATE 875-125 MG PO TABS: 1.0000 | ORAL_TABLET | Freq: Two times a day (BID) | ORAL | 0 refills | Status: AC

## 2023-03-01 ENCOUNTER — Encounter: Payer: Self-pay | Admitting: Nurse Practitioner

## 2023-03-01 DIAGNOSIS — Z6829 Body mass index (BMI) 29.0-29.9, adult: Secondary | ICD-10-CM

## 2023-03-28 ENCOUNTER — Other Ambulatory Visit: Payer: Self-pay

## 2023-03-28 MED ORDER — LORAZEPAM 0.5 MG PO TABS: 0.5000 mg | ORAL_TABLET | Freq: Every day | ORAL | 0 refills | Status: DC | PRN

## 2023-05-01 ENCOUNTER — Ambulatory Visit: Payer: Self-pay

## 2023-05-01 ENCOUNTER — Ambulatory Visit
Admission: RE | Admit: 2023-05-01 | Discharge: 2023-05-01 | Disposition: A | Discharge status: Home / Self Care | Source: Ambulatory Visit | Attending: Nurse Practitioner

## 2023-05-01 ENCOUNTER — Encounter: Payer: Self-pay | Admitting: Nurse Practitioner

## 2023-05-01 ENCOUNTER — Ambulatory Visit
Admission: RE | Admit: 2023-05-01 | Discharge: 2023-05-01 | Disposition: A | Discharge status: Home / Self Care | Attending: Nurse Practitioner | Admitting: Nurse Practitioner

## 2023-05-01 ENCOUNTER — Ambulatory Visit: Admitting: Nurse Practitioner

## 2023-05-01 VITALS — BP 113/74 | HR 65 | Temp 98.3°F | Resp 17 | Ht 65.0 in | Wt 173.6 lb

## 2023-05-01 DIAGNOSIS — M25552 Pain in left hip: Secondary | ICD-10-CM

## 2023-05-01 MED ORDER — PREDNISONE 20 MG PO TABS
40.0000 mg | ORAL_TABLET | Freq: Every day | ORAL | 0 refills | Status: DC
Start: 2023-05-01 — End: 2023-08-12

## 2023-05-01 MED ORDER — DICLOFENAC SODIUM 1 % EX GEL: 2.0000 g | Freq: Four times a day (QID) | CUTANEOUS | 2 refills | Status: AC

## 2023-05-01 MED ORDER — IBUPROFEN 800 MG PO TABS: 800.0000 mg | ORAL_TABLET | Freq: Three times a day (TID) | ORAL | 0 refills | Status: DC | PRN

## 2023-05-01 NOTE — Telephone Encounter (Signed)
  Chief Complaint: hip pain Symptoms: pain Frequency: constant   Disposition: [] ED /[] Urgent Care (no appt availability in office) / [x] Appointment(In office/virtual)/ []  [facility]/ [] Home Care/ [] Refused Recommended Disposition /[] [facility]/ []  Follow-up with PCP Additional Notes: Pt calling with left hip pain. Pt stated the pain has intensified over last several weeks. Pt described pain as sharp, dull, and burning. Rated pain 10/10. Pt has taken Goody's for pain relief with little relief.  Pt is not sure of cause. No known injury. Pt stated she has been going to gym only walking on treadmill with no incline. Pt is leaving work due to pain. Pt has appt today at  1340. RN gave care advice and pt verbalized understanding.               Copied from CRM [id number]. Topic: Clinical - Red Word Triage >> May 01, 2023 10:43 AM Juluis Ok wrote: [facility] that prompted transfer to Nurse Triage: lt hip pain 10/10 Reason for Disposition  [1] SEVERE pain (e.g., excruciating, unable to do any normal activities) AND [2] not improved after 2 hours of pain medicine  Answer Assessment - Initial Assessment Questions 1. LOCATION and RADIATION: "Where is the pain located?"      Left hip- radiates to knee 2. QUALITY: "What does the pain feel like?"  (e.g., sharp, dull, aching, burning)     Sharp, burning, aching 3. SEVERITY: "How bad is the pain?" "What does it keep you from doing?"   (Scale 1-10; or mild, moderate, severe)   -  MILD (1-3): doesn't interfere with normal activities    -  MODERATE (4-7): interferes with normal activities (e.g., work or school) or awakens from sleep, limping    -  SEVERE (8-10): excruciating pain, unable to do any normal activities, unable to walk     10 4. ONSET: "When did the pain start?" "Does it come and go, or is it there all the time?"     2 weeks 5. WORK OR EXERCISE: "Has there been any recent work or exercise that involved this part of  the body?"      Walking on treadmill 6. CAUSE: "What do you think is causing the hip pain?"      Not sure 7. AGGRAVATING FACTORS: "What makes the hip pain worse?" (e.g., walking, climbing stairs, running)    Everything hurts 8. OTHER SYMPTOMS: "Do you have any other symptoms?" (e.g., back pain, pain shooting down leg,  fever, rash)     denies  Protocols used: Hip Pain-A-AH

## 2023-05-01 NOTE — Progress Notes (Signed)
 BP 113/74 (BP Location: Right Arm, Patient Position: Sitting, Cuff Size: Large)   Pulse 65   Temp 98.3 F (36.8 C) (Oral)   Resp 17   Ht 5\' 5"  (1.651 m)   Wt 173 lb 9.6 oz (78.7 kg)   LMP 08/24/2018   SpO2 98%   BMI 28.89 kg/m    Subjective:    Patient ID: Dayton Evener, female    DOB: 1965-01-30, 58 y.o.   MRN: [id number]  HPI: Rilee Knoll is a 58 y.o. female  Chief Complaint  Patient presents with   Hip Pain    Left hip. Throbbing pain from hip down outer thigh. Started hurting about 2 weeks ago but yesterday pain was unbearable. No xrays   HIP PAIN Duration:  2 weeks but yesterday it significantly worsened. Involved hip: left  Mechanism of injury: unknown Location: lateral Onset: gradual  Severity: 10/10  Quality: sharp, dull, aching, burning, and throbbing Frequency: constant Radiation: yes Aggravating factors: weight bearing  and laying Alleviating factors:  Goodies    Status: worse Treatments attempted:  Goodies and rest   Relief with NSAIDs?: mild Weakness with weight bearing: no Weakness with walking: no Paresthesias / decreased sensation: no Swelling: no Redness:no Fevers: no  Relevant past medical, surgical, family and social history reviewed and updated as indicated. Interim medical history since our last visit reviewed. Allergies and medications reviewed and updated.  Review of Systems  Musculoskeletal:        Left hip pain    Per HPI unless specifically indicated above     Objective:    BP 113/74 (BP Location: Right Arm, Patient Position: Sitting, Cuff Size: Large)   Pulse 65   Temp 98.3 F (36.8 C) (Oral)   Resp 17   Ht 5\' 5"  (1.651 m)   Wt 173 lb 9.6 oz (78.7 kg)   LMP 08/24/2018   SpO2 98%   BMI 28.89 kg/m   Wt Readings from Last 3 Encounters:  05/01/23 173 lb 9.6 oz (78.7 kg)  01/16/23 176 lb 9.6 oz (80.1 kg)  10/23/22 167 lb (75.8 kg)    Physical Exam Vitals and nursing note reviewed.  Constitutional:       General: She is not in acute distress.    Appearance: Normal appearance. She is normal weight. She is not ill-appearing, toxic-appearing or diaphoretic.  HENT:     Head: Normocephalic.     Right Ear: External ear normal.     Left Ear: External ear normal.     Nose: Nose normal.     Mouth/Throat:     Mouth: Mucous membranes are moist.     Pharynx: Oropharynx is clear.  Eyes:     General:        Right eye: No discharge.        Left eye: No discharge.     Extraocular Movements: Extraocular movements intact.     Conjunctiva/sclera: Conjunctivae normal.     Pupils: Pupils are equal, round, and reactive to light.  Cardiovascular:     Rate and Rhythm: Normal rate and regular rhythm.     Heart sounds: No murmur heard. Pulmonary:     Effort: Pulmonary effort is normal. No respiratory distress.     Breath sounds: Normal breath sounds. No wheezing or rales.  Musculoskeletal:     Cervical back: Normal range of motion and neck supple.     Left hip: Tenderness and bony tenderness present. Decreased range of motion.  Skin:  General: Skin is warm and dry.     Capillary Refill: Capillary refill takes less than 2 seconds.  Neurological:     General: No focal deficit present.     Mental Status: She is alert and oriented to person, place, and time. Mental status is at baseline.  Psychiatric:        Mood and Affect: Mood normal.        Behavior: Behavior normal.        Thought Content: Thought content normal.        Judgment: Judgment normal.     Results for orders placed or performed in visit on 10/20/22  Urine Culture   Collection Time: 10/20/22  9:34 AM   Specimen: Urine   UR  Result Value Ref Range   Urine Culture, Routine Final report    Organism ID, Bacteria Comment   Urinalysis, Routine w reflex microscopic   Collection Time: 10/20/22  9:34 AM  Result Value Ref Range   Specific Gravity, UA >1.030 (H) 1.005 - 1.030   pH, UA 5.5 5.0 - 7.5   Color, UA Yellow Yellow   Appearance  Ur Clear Clear   Leukocytes,UA Negative Negative   Protein,UA Trace (A) Negative/Trace   Glucose, UA Negative Negative   Ketones, UA Negative Negative   RBC, UA Negative Negative   Bilirubin, UA Negative Negative   Urobilinogen, Ur 0.2 0.2 - 1.0 mg/dL   Nitrite, UA Negative Negative   Microscopic Examination Comment       Assessment & Plan:   Problem List Items Addressed This Visit   None Visit Diagnoses       Left hip pain    -  Primary   Will treat with prednisone .  Declined Toradol  in office.  Will send Ibuprofen  for pain control.  Xray ordered for evaluation.   Relevant Medications   predniSONE  (DELTASONE ) 20 MG tablet   Other Relevant Orders   DG Hip Unilat W OR W/O Pelvis 2-3 Views Left   DG Lumbar Spine Complete        Follow up plan: Return if symptoms worsen or fail to improve.

## 2023-05-02 ENCOUNTER — Encounter: Payer: Self-pay | Admitting: Nurse Practitioner

## 2023-05-09 ENCOUNTER — Encounter: Payer: Self-pay | Admitting: Nurse Practitioner

## 2023-05-28 ENCOUNTER — Other Ambulatory Visit: Payer: Self-pay | Admitting: Nurse Practitioner

## 2023-05-30 NOTE — Telephone Encounter (Signed)
 Requested Prescriptions  Pending Prescriptions Disp Refills   ibuprofen  (ADVIL ) 800 MG tablet [Pharmacy Med Name: IBUPROFEN  800 MG TABLET] 90 tablet 1    Sig: TAKE 1 TABLET BY MOUTH EVERY 8 HOURS AS NEEDED     Analgesics:  NSAIDS Failed - 05/30/2023  8:44 AM      Failed - Manual Review: Labs are only required if the patient has taken medication for more than 8 weeks.      Passed - Cr in normal range and within 360 days    Creatinine  Date Value Ref Range Status  12/13/2011 0.70 0.60 - 1.30 mg/dL Final   Creatinine, Ser  Date Value Ref Range Status  09/13/2022 0.71 0.44 - 1.00 mg/dL Final         Passed - HGB in normal range and within 360 days    Hemoglobin  Date Value Ref Range Status  09/13/2022 12.7 12.0 - 15.0 g/dL Final  16/10/9602 54.0 11.1 - 15.9 g/dL Final         Passed - PLT in normal range and within 360 days    Platelets  Date Value Ref Range Status  09/13/2022 338 150 - 400 K/uL Final  08/02/2022 385 150 - 450 x10E3/uL Final         Passed - HCT in normal range and within 360 days    HCT  Date Value Ref Range Status  09/13/2022 38.4 36.0 - 46.0 % Final   Hematocrit  Date Value Ref Range Status  08/02/2022 44.6 34.0 - 46.6 % Final         Passed - eGFR is 30 or above and within 360 days    EGFR (African American)  Date Value Ref Range Status  12/13/2011 >60  Final   GFR calc Af Amer  Date Value Ref Range Status  10/02/2016 >60 >60 mL/min Final    Comment:    (NOTE) The eGFR has been calculated using the CKD EPI equation. This calculation has not been validated in all clinical situations. eGFR's persistently <60 mL/min signify possible Chronic Kidney Disease.    EGFR (Non-African Amer.)  Date Value Ref Range Status  12/13/2011 >60  Final    Comment:    eGFR values <60mL/min/1.73 m2 may be an indication of chronic kidney disease (CKD). Calculated eGFR is useful in patients with stable renal function. The eGFR calculation will not be reliable  in acutely ill patients when serum creatinine is changing rapidly. It is not useful in  patients on dialysis. The eGFR calculation may not be applicable to patients at the low and high extremes of body sizes, pregnant women, and vegetarians.    GFR, Estimated  Date Value Ref Range Status  09/13/2022 >60 >60 mL/min Final    Comment:    (NOTE) Calculated using the CKD-EPI Creatinine Equation (2021)    eGFR  Date Value Ref Range Status  08/02/2022 93 >59 mL/min/1.73 Final         Passed - Patient is not pregnant      Passed - Valid encounter within last 12 months    Recent Outpatient Visits           4 weeks ago Left hip pain   [facility] Aileen Alexanders, NP   3 months ago Acute bacterial conjunctivitis of left eye   [facility] Lemar Pyles, NP       Future Appointments  In 2 months Cannady, Lavelle Posey, NP [facility]

## 2023-07-24 ENCOUNTER — Emergency Department
Admission: EM | Admit: 2023-07-24 | Discharge: 2023-07-24 | Disposition: A | Discharge status: Home / Self Care | Attending: Emergency Medicine | Admitting: Emergency Medicine

## 2023-07-24 ENCOUNTER — Emergency Department

## 2023-07-24 ENCOUNTER — Other Ambulatory Visit: Payer: Self-pay

## 2023-07-24 DIAGNOSIS — N12 Tubulo-interstitial nephritis, not specified as acute or chronic: Secondary | ICD-10-CM | POA: Diagnosis not present

## 2023-07-24 DIAGNOSIS — R319 Hematuria, unspecified: Secondary | ICD-10-CM | POA: Diagnosis present

## 2023-07-24 LAB — CBC
HCT: 45.1 % (ref 36.0–46.0)
Hemoglobin: 14.6 g/dL (ref 12.0–15.0)
MCH: 28.9 pg (ref 26.0–34.0)
MCHC: 32.4 g/dL (ref 30.0–36.0)
MCV: 89.1 fL (ref 80.0–100.0)
Platelets: 365 K/uL (ref 150–400)
RBC: 5.06 MIL/uL (ref 3.87–5.11)
RDW: 12.6 % (ref 11.5–15.5)
WBC: 9.9 K/uL (ref 4.0–10.5)
nRBC: 0 % (ref 0.0–0.2)

## 2023-07-24 LAB — COMPREHENSIVE METABOLIC PANEL WITH GFR
ALT: 25 U/L (ref 0–44)
AST: 19 U/L (ref 15–41)
Albumin: 4.2 g/dL (ref 3.5–5.0)
Alkaline Phosphatase: 60 U/L (ref 38–126)
Anion gap: 10 (ref 5–15)
BUN: 24 mg/dL — ABNORMAL HIGH (ref 6–20)
CO2: 24 mmol/L (ref 22–32)
Calcium: 9.3 mg/dL (ref 8.9–10.3)
Chloride: 106 mmol/L (ref 98–111)
Creatinine, Ser: 0.73 mg/dL (ref 0.44–1.00)
GFR, Estimated: >60 mL/min (ref >60)
Glucose, Bld: 94 mg/dL (ref 70–99)
Potassium: 4 mmol/L (ref 3.5–5.1)
Sodium: 140 mmol/L (ref 135–145)
Total Bilirubin: 0.3 mg/dL (ref 0.0–1.2)
Total Protein: 7.3 g/dL (ref 6.5–8.1)

## 2023-07-24 LAB — URINALYSIS, ROUTINE W REFLEX MICROSCOPIC
Bilirubin Urine: NEGATIVE
Glucose, UA: NEGATIVE mg/dL
Ketones, ur: NEGATIVE mg/dL
Nitrite: NEGATIVE
Protein, ur: NEGATIVE mg/dL
Specific Gravity, Urine: 1.025 (ref 1.005–1.030)
WBC, UA: >50 WBC/hpf (ref 0–5)
pH: 5 (ref 5.0–8.0)

## 2023-07-24 LAB — LIPASE, BLOOD: Lipase: 41 U/L (ref 11–51)

## 2023-07-24 MED ORDER — CEFDINIR 300 MG PO CAPS: 300.0000 mg | ORAL_CAPSULE | Freq: Two times a day (BID) | ORAL | 0 refills | Status: AC

## 2023-07-24 MED ORDER — ACETAMINOPHEN 500 MG PO TABS
1000.0000 mg | ORAL_TABLET | Freq: Once | ORAL | Status: AC
  Administered 2023-07-24: 1000 mg via ORAL
  Filled 2023-07-24: qty 2

## 2023-07-24 MED ORDER — IBUPROFEN 600 MG PO TABS: 600.0000 mg | ORAL_TABLET | Freq: Four times a day (QID) | ORAL | 0 refills | Status: AC | PRN

## 2023-07-24 MED ORDER — IBUPROFEN 600 MG PO TABS
600.0000 mg | ORAL_TABLET | Freq: Once | ORAL | Status: AC
  Administered 2023-07-24: 600 mg via ORAL
  Filled 2023-07-24: qty 1

## 2023-07-24 MED ORDER — LIDOCAINE 5 % EX PTCH: 1.0000 | MEDICATED_PATCH | CUTANEOUS | 0 refills | Status: AC

## 2023-07-24 MED ORDER — ONDANSETRON 4 MG PO TBDP: 4.0000 mg | ORAL_TABLET | Freq: Three times a day (TID) | ORAL | 0 refills | Status: AC | PRN

## 2023-07-24 MED ORDER — ONDANSETRON 4 MG PO TBDP
4.0000 mg | ORAL_TABLET | Freq: Once | ORAL | Status: AC
  Administered 2023-07-24: 4 mg via ORAL
  Filled 2023-07-24: qty 1

## 2023-07-24 NOTE — ED Provider Notes (Signed)
 [facility] Provider Note    Event Date/Time   First MD Initiated Contact with Patient 07/24/23 [phone]     (approximate)   History   Hematuria   HPI  Christine Chase is a 58 y.o. female who has history of kidney stones, kidney infection who comes in with concern for back pain.  Patient reports 2 days of left back pain and some nausea.  She reports that this morning she noticed a little bit of blood in her urine and she is concerned that she could have a kidney infection.  She denies any known fevers.  She did not take anything prior to coming in.  Denies any chest pain, shortness of breath  Physical Exam   Triage Vital Signs: ED Triage Vitals [07/24/23 0944]  Encounter Vitals Group     BP (!) 146/78     Girls Systolic BP Percentile      Girls Diastolic BP Percentile      Boys Systolic BP Percentile      Boys Diastolic BP Percentile      Pulse Rate 69     Resp 20     Temp 98.1 F (36.7 C)     Temp Source Oral     SpO2 100 %     Weight      Height      Head Circumference      Peak Flow      Pain Score 9     Pain Loc      Pain Education      Exclude from Growth Chart     Most recent vital signs: Vitals:   07/24/23 0944  BP: (!) 146/78  Pulse: 69  Resp: 20  Temp: 98.1 F (36.7 C)  SpO2: 100%     General: Awake, no distress.  CV:  Good peripheral perfusion.  Resp:  Normal effort.  Abd:  No distention.  Left flank pain. No rash. Mild tenderness lower abdomen more on left. No revound no gaurding.  Other:     ED Results / Procedures / Treatments   Labs (all labs ordered are listed, but only abnormal results are displayed) Labs Reviewed  CBC  LIPASE, BLOOD  COMPREHENSIVE METABOLIC PANEL WITH GFR  URINALYSIS, ROUTINE W REFLEX MICROSCOPIC      RADIOLOGY I have reviewed the ct personally and interpreted no obvious kidney stone    PROCEDURES:  Critical Care performed: No  Procedures   MEDICATIONS ORDERED IN  ED: Medications  ibuprofen  (ADVIL ) tablet 600 mg (600 mg Oral Given 07/24/23 1015)  acetaminophen  (TYLENOL ) tablet 1,000 mg (1,000 mg Oral Given 07/24/23 1015)  ondansetron  (ZOFRAN -ODT) disintegrating tablet 4 mg (4 mg Oral Given 07/24/23 1016)     IMPRESSION / MDM / ASSESSMENT AND PLAN / ED COURSE  I reviewed the triage vital signs and the nursing notes.   Patient's presentation is most consistent with acute presentation with potential threat to life or bodily function.   Differential includes kidney stone, pyelonephritis.  Patient would like to avoid IV.  I think it be reasonable to do CT without contrast.  Blood work also ordered evaluate for Electra abnormalities, AKI, UTI  IMPRESSION: 1. No acute findings or explanation for the patient's symptoms. No evidence of urinary tract calculus or hydronephrosis. 2. Sigmoid diverticulosis without evidence of acute inflammation. 3. The previously demonstrated right renal process is suboptimally assessed on this noncontrast study but presumably reflected pyelonephritis. No suspicious residual focal abnormality or perinephric soft tissue  stranding on noncontrast imaging.  UA with concerns for UTI, added on culture.  Lipase normal CMP normal CBC reassuring  This seems consistent with pyelonephritis considered admission given reassuring vital signs patient can be trialed on outpatient antibiotic  Patient understands that if her symptoms are worsening that she should return to the ER for repeat evaluation  The patient is on the cardiac monitor to evaluate for evidence of arrhythmia and/or significant heart rate changes.      FINAL CLINICAL IMPRESSION(S) / ED DIAGNOSES   Final diagnoses:  Pyelonephritis     Rx / DC Orders   ED Discharge Orders          Ordered    cefdinir  (OMNICEF ) 300 MG capsule  2 times daily        07/24/23 1145    ondansetron  (ZOFRAN -ODT) 4 MG disintegrating tablet  Every 8 hours PRN        07/24/23 1145     ibuprofen  (ADVIL ) 600 MG tablet  Every 6 hours PRN        07/24/23 1145    lidocaine  (LIDODERM ) 5 %  Every 24 hours        07/24/23 1145             Note:  This document was prepared using Dragon voice recognition software and may include unintentional dictation errors.   Ernest Ronal BRAVO, MD 07/24/23 1147

## 2023-07-24 NOTE — ED Triage Notes (Signed)
 Pt to ED via POV from home. Pt ambulatory to triage. Pt reports lower back pain and nausea x2 days. Pt reports this morning started to have blood in her urine.

## 2023-07-24 NOTE — Discharge Instructions (Signed)
 To the antibiotics to help with a concern for infection of your kidneys.  Take Tylenol  1 g every 8 hours and alternate with ibuprofen  return to the ER if develop worsening symptoms fevers or other concerns

## 2023-07-25 LAB — URINE CULTURE: Culture: <10000 — AB

## 2023-08-12 NOTE — Patient Instructions (Incomplete)
 Please call to schedule your mammogram and/or bone density: [facility] at [facility]  Address: [address], [place], [place] [address] Phone: [phone]  [facility] [address] [place],  [place]  [address] Phone: [phone]   Healthy Eating, Adult Healthy eating may help you get and keep a healthy body weight, reduce the risk of chronic disease, and live a long and productive life. It is important to follow a healthy eating pattern. Your nutritional and calorie needs should be met mainly by different nutrient-rich foods. What are tips for following this plan? Reading food labels Read labels and choose the following: Reduced or low sodium products. Juices with 100% fruit juice. Foods with low saturated fats (<3 g per serving) and high polyunsaturated and monounsaturated fats. Foods with whole grains, such as whole wheat, cracked wheat, brown rice, and wild rice. Whole grains that are fortified with folic acid. This is recommended for females who are pregnant or who want to become pregnant. Read labels and do not eat or drink the following: Foods or drinks with added sugars. These include foods that contain brown sugar, corn sweetener, corn syrup, dextrose, fructose, glucose, high-fructose corn syrup, honey, invert sugar, lactose, malt syrup, maltose, molasses, raw sugar, sucrose, trehalose, or turbinado sugar. Limit your intake of added sugars to less than 10% of your total daily calories. Do not eat more than the following amounts of added sugar per day: 6 teaspoons (25 g) for females. 9 teaspoons (38 g) for males. Foods that contain processed or refined starches and grains. Refined grain products, such as white flour, degermed cornmeal, white bread, and white rice. Shopping Choose nutrient-rich snacks, such as vegetables, whole fruits, and nuts. Avoid high-calorie and high-sugar snacks, such as potato chips, fruit  snacks, and candy. Use oil-based dressings and spreads on foods instead of solid fats such as butter, margarine, sour cream, or cream cheese. Limit pre-made sauces, mixes, and "instant" products such as flavored rice, instant noodles, and ready-made pasta. Try more plant-protein sources, such as tofu, tempeh, black beans, edamame, lentils, nuts, and seeds. Explore eating plans such as the Mediterranean diet or vegetarian diet. Try heart-healthy dips made with beans and healthy fats like hummus and guacamole. Vegetables go great with these. Cooking Use oil to saut or stir-fry foods instead of solid fats such as butter, margarine, or lard. Try baking, boiling, grilling, or broiling instead of frying. Remove the fatty part of meats before cooking. Steam vegetables in water or broth. Meal planning  At meals, imagine dividing your plate into fourths: One-half of your plate is fruits and vegetables. One-fourth of your plate is whole grains. One-fourth of your plate is protein, especially lean meats, poultry, eggs, tofu, beans, or nuts. Include low-fat dairy as part of your daily diet. Lifestyle Choose healthy options in all settings, including home, work, school, restaurants, or stores. Prepare your food safely: Wash your hands after handling raw meats. Where you prepare food, keep surfaces clean by regularly washing with hot, soapy water. Keep raw meats separate from ready-to-eat foods, such as fruits and vegetables. Cook seafood, meat, poultry, and eggs to the recommended temperature. Get a food thermometer. Store foods at safe temperatures. In general: Keep cold foods at 75F (4.4C) or below. Keep hot foods at 175F (60C) or above. Keep your freezer at [facility] (-17.8C) or below. Foods are not safe to eat if they have been between the temperatures of 40-175F (4.4-60C) for more than  2 hours. What foods should I eat? Fruits Aim to eat 1-2 cups of fresh, canned (in natural juice), or  frozen fruits each day. One cup of fruit equals 1 small apple, 1 large banana, 8 large strawberries, 1 cup (237 g) canned fruit,  cup (82 g) dried fruit, or 1 cup (240 mL) 100% juice. Vegetables Aim to eat 2-4 cups of fresh and frozen vegetables each day, including different varieties and colors. One cup of vegetables equals 1 cup (91 g) broccoli or cauliflower florets, 2 medium carrots, 2 cups (150 g) raw, leafy greens, 1 large tomato, 1 large bell pepper, 1 large sweet potato, or 1 medium white potato. Grains Aim to eat 5-10 ounce-equivalents of whole grains each day. Examples of 1 ounce-equivalent of grains include 1 slice of bread, 1 cup (40 g) ready-to-eat cereal, 3 cups (24 g) popcorn, or  cup (93 g) cooked rice. Meats and other proteins Try to eat 5-7 ounce-equivalents of protein each day. Examples of 1 ounce-equivalent of protein include 1 egg,  oz nuts (12 almonds, 24 pistachios, or 7 walnut halves), 1/4 cup (90 g) cooked beans, 6 tablespoons (90 g) hummus or 1 tablespoon (16 g) peanut butter. A cut of meat or fish that is the size of a deck of cards is about 3-4 ounce-equivalents (85 g). Of the protein you eat each week, try to have at least 8 sounce (227 g) of seafood. This is about 2 servings per week. This includes salmon, trout, herring, sardines, and anchovies. Dairy Aim to eat 3 cup-equivalents of fat-free or low-fat dairy each day. Examples of 1 cup-equivalent of dairy include 1 cup (240 mL) milk, 8 ounces (250 g) yogurt, 1 ounces (44 g) natural cheese, or 1 cup (240 mL) fortified soy milk. Fats and oils Aim for about 5 teaspoons (21 g) of fats and oils per day. Choose monounsaturated fats, such as canola and olive oils, mayonnaise made with olive oil or avocado oil, avocados, peanut butter, and most nuts, or polyunsaturated fats, such as sunflower, corn, and soybean oils, walnuts, pine nuts, sesame seeds, sunflower seeds, and flaxseed. Beverages Aim for 6 eight-ounce glasses of  water per day. Limit coffee to 3-5 eight-ounce cups per day. Limit caffeinated beverages that have added calories, such as soda and energy drinks. If you drink alcohol: Limit how much you have to: 0-1 drink a day if you are female. 0-2 drinks a day if you are female. Know how much alcohol is in your drink. In the [place] one drink is one 12 oz bottle of beer (355 mL), one 5 oz glass of wine (148 mL), or one 1 oz glass of hard liquor (44 mL). Seasoning and other foods Try not to add too much salt to your food. Try using herbs and spices instead of salt. Try not to add sugar to food. This information is based on [place] nutrition guidelines. To learn more, visit [employer].be. Exact amounts may vary. You may need different amounts. This information is not intended to replace advice given to you by your health care provider. Make sure you discuss any questions you have with your health care provider. Document Revised: 09/26/2021 Document Reviewed: 09/26/2021 Elsevier Patient Education  2024 [employer].

## 2023-08-17 ENCOUNTER — Encounter: Payer: Self-pay | Admitting: Nurse Practitioner

## 2023-08-17 DIAGNOSIS — I10 Essential (primary) hypertension: Secondary | ICD-10-CM

## 2023-08-17 DIAGNOSIS — Z1211 Encounter for screening for malignant neoplasm of colon: Secondary | ICD-10-CM

## 2023-08-17 DIAGNOSIS — Z124 Encounter for screening for malignant neoplasm of cervix: Secondary | ICD-10-CM

## 2023-08-17 DIAGNOSIS — Z0000 Encounter for general adult medical examination without abnormal findings: Secondary | ICD-10-CM

## 2023-08-17 DIAGNOSIS — Z86718 Personal history of other venous thrombosis and embolism: Secondary | ICD-10-CM

## 2023-08-17 DIAGNOSIS — Z1231 Encounter for screening mammogram for malignant neoplasm of breast: Secondary | ICD-10-CM

## 2023-08-17 DIAGNOSIS — E7800 Pure hypercholesterolemia, unspecified: Secondary | ICD-10-CM

## 2023-08-17 DIAGNOSIS — Z6828 Body mass index (BMI) 28.0-28.9, adult: Secondary | ICD-10-CM

## 2023-08-17 DIAGNOSIS — R7301 Impaired fasting glucose: Secondary | ICD-10-CM

## 2023-09-16 ENCOUNTER — Other Ambulatory Visit: Payer: Self-pay | Admitting: Nurse Practitioner

## 2023-09-17 NOTE — Telephone Encounter (Signed)
 Requested Prescriptions  Refused Prescriptions Disp Refills   ibuprofen  (ADVIL ) 800 MG tablet [Pharmacy Med Name: IBUPROFEN  800 MG TABLET] 90 tablet 1    Sig: TAKE 1 TABLET BY MOUTH EVERY 8 HOURS AS NEEDED     Analgesics:  NSAIDS Failed - 09/17/2023  5:22 PM      Failed - Manual Review: Labs are only required if the patient has taken medication for more than 8 weeks.      Passed - Cr in normal range and within 360 days    Creatinine  Date Value Ref Range Status  12/13/2011 0.70 0.60 - 1.30 mg/dL Final   Creatinine, Ser  Date Value Ref Range Status  07/24/2023 0.73 0.44 - 1.00 mg/dL Final         Passed - HGB in normal range and within 360 days    Hemoglobin  Date Value Ref Range Status  07/24/2023 14.6 12.0 - 15.0 g/dL Final  92/75/7975 84.9 11.1 - 15.9 g/dL Final         Passed - PLT in normal range and within 360 days    Platelets  Date Value Ref Range Status  07/24/2023 365 150 - 400 K/uL Final  08/02/2022 385 150 - 450 x10E3/uL Final         Passed - HCT in normal range and within 360 days    HCT  Date Value Ref Range Status  07/24/2023 45.1 36.0 - 46.0 % Final   Hematocrit  Date Value Ref Range Status  08/02/2022 44.6 34.0 - 46.6 % Final         Passed - eGFR is 30 or above and within 360 days    EGFR (African American)  Date Value Ref Range Status  12/13/2011 >60  Final   GFR calc Af Amer  Date Value Ref Range Status  10/02/2016 >60 >60 mL/min Final    Comment:    (NOTE) The eGFR has been calculated using the CKD EPI equation. This calculation has not been validated in all clinical situations. eGFR's persistently <60 mL/min signify possible Chronic Kidney Disease.    EGFR (Non-African Amer.)  Date Value Ref Range Status  12/13/2011 >60  Final    Comment:    eGFR values <56mL/min/1.73 m2 may be an indication of chronic kidney disease (CKD). Calculated eGFR is useful in patients with stable renal function. The eGFR calculation will not be reliable  in acutely ill patients when serum creatinine is changing rapidly. It is not useful in  patients on dialysis. The eGFR calculation may not be applicable to patients at the low and high extremes of body sizes, pregnant women, and vegetarians.    GFR, Estimated  Date Value Ref Range Status  07/24/2023 >60 >60 mL/min Final    Comment:    (NOTE) Calculated using the CKD-EPI Creatinine Equation (2021)    eGFR  Date Value Ref Range Status  08/02/2022 93 >59 mL/min/1.73 Final         Passed - Patient is not pregnant      Passed - Valid encounter within last 12 months    Recent Outpatient Visits           4 months ago Left hip pain   [place] [facility] Melvin Pao, NP   7 months ago Acute bacterial conjunctivitis of left eye   [place] [facility] [place], Melanie DASEN, NP

## 2023-10-10 ENCOUNTER — Other Ambulatory Visit: Payer: Self-pay | Admitting: Nurse Practitioner

## 2023-10-11 NOTE — Telephone Encounter (Signed)
 Requested medications are due for refill today.  yes  Requested medications are on the active medications list.  yes  Last refill. 01/16/2023 #180 2 rf  Future visit scheduled.   no  Notes to clinic.  A1C lab is expired. Pt missed appt on 08/17/2023.    Requested Prescriptions  Pending Prescriptions Disp Refills   metFORMIN  (GLUCOPHAGE ) 500 MG tablet [Pharmacy Med Name: METFORMIN  HCL 500 MG TABLET] 180 tablet 2    Sig: TAKE 1 TABLET BY MOUTH 2 TIMES DAILY WITH A MEAL.     Endocrinology:  Diabetes - Biguanides Failed - 10/11/2023  2:24 PM      Failed - HBA1C is between 0 and 7.9 and within 180 days    Hgb A1c MFr Bld  Date Value Ref Range Status  08/02/2022 5.6 4.8 - 5.6 % Final    Comment:             Prediabetes: 5.7 - 6.4          Diabetes: >6.4          Glycemic control for adults with diabetes: <7.0          Failed - B12 Level in normal range and within 720 days    No results found for: VITAMINB12       Failed - Valid encounter within last 6 months    Recent Outpatient Visits           5 months ago Left hip pain   [place] [facility] Melvin Pao, NP   7 months ago Acute bacterial conjunctivitis of left eye   [place] [facility] [place], [place] T, NP              Failed - CBC within normal limits and completed in the last 12 months    WBC  Date Value Ref Range Status  07/24/2023 9.9 4.0 - 10.5 K/uL Final   RBC  Date Value Ref Range Status  07/24/2023 5.06 3.87 - 5.11 MIL/uL Final   Hemoglobin  Date Value Ref Range Status  07/24/2023 14.6 12.0 - 15.0 g/dL Final  92/75/7975 84.9 11.1 - 15.9 g/dL Final   HCT  Date Value Ref Range Status  07/24/2023 45.1 36.0 - 46.0 % Final   Hematocrit  Date Value Ref Range Status  08/02/2022 44.6 34.0 - 46.6 % Final   MCHC  Date Value Ref Range Status  07/24/2023 32.4 30.0 - 36.0 g/dL Final   [facility]  Date Value Ref Range Status  07/24/2023 28.9 26.0 - 34.0 pg Final   MCV   Date Value Ref Range Status  07/24/2023 89.1 80.0 - 100.0 fL Final  08/02/2022 88 79 - 97 fL Final  12/13/2011 76 (L) 80 - 100 fL Final   No results found for: PLTCOUNTKUC, LABPLAT, POCPLA RDW  Date Value Ref Range Status  07/24/2023 12.6 11.5 - 15.5 % Final  08/02/2022 12.4 11.7 - 15.4 % Final  12/13/2011 15.1 (H) 11.5 - 14.5 % Final         Passed - Cr in normal range and within 360 days    Creatinine  Date Value Ref Range Status  12/13/2011 0.70 0.60 - 1.30 mg/dL Final   Creatinine, Ser  Date Value Ref Range Status  07/24/2023 0.73 0.44 - 1.00 mg/dL Final         Passed - eGFR in normal range and within 360 days    EGFR (African American)  Date Value Ref Range  Status  12/13/2011 >60  Final   GFR calc Af Amer  Date Value Ref Range Status  10/02/2016 >60 >60 mL/min Final    Comment:    (NOTE) The eGFR has been calculated using the CKD EPI equation. This calculation has not been validated in all clinical situations. eGFR's persistently <60 mL/min signify possible Chronic Kidney Disease.    EGFR (Non-African Amer.)  Date Value Ref Range Status  12/13/2011 >60  Final    Comment:    eGFR values <57mL/min/1.73 m2 may be an indication of chronic kidney disease (CKD). Calculated eGFR is useful in patients with stable renal function. The eGFR calculation will not be reliable in acutely ill patients when serum creatinine is changing rapidly. It is not useful in  patients on dialysis. The eGFR calculation may not be applicable to patients at the low and high extremes of body sizes, pregnant women, and vegetarians.    GFR, Estimated  Date Value Ref Range Status  07/24/2023 >60 >60 mL/min Final    Comment:    (NOTE) Calculated using the CKD-EPI Creatinine Equation (2021)    eGFR  Date Value Ref Range Status  08/02/2022 93 >59 mL/min/1.73 Final

## 2023-10-15 NOTE — Telephone Encounter (Signed)
Left message on machine asking patient to call back to schedule an appointment.

## 2023-10-17 NOTE — Telephone Encounter (Signed)
 Called patient and left a message for her to call back to get scheduled.

## 2023-10-25 NOTE — Telephone Encounter (Signed)
 Admin has tried to schedule her for needed appointment without reaching her.

## 2023-11-09 ENCOUNTER — Ambulatory Visit: Admitting: Pediatrics

## 2023-12-10 ENCOUNTER — Emergency Department
Admission: EM | Admit: 2023-12-10 | Discharge: 2023-12-10 | Disposition: A | Discharge status: Home / Self Care | Attending: Emergency Medicine | Admitting: Emergency Medicine

## 2023-12-10 ENCOUNTER — Other Ambulatory Visit: Payer: Self-pay

## 2023-12-10 DIAGNOSIS — J069 Acute upper respiratory infection, unspecified: Secondary | ICD-10-CM | POA: Insufficient documentation

## 2023-12-10 DIAGNOSIS — B9789 Other viral agents as the cause of diseases classified elsewhere: Secondary | ICD-10-CM | POA: Insufficient documentation

## 2023-12-10 DIAGNOSIS — R059 Cough, unspecified: Secondary | ICD-10-CM | POA: Diagnosis present

## 2023-12-10 DIAGNOSIS — I10 Essential (primary) hypertension: Secondary | ICD-10-CM | POA: Diagnosis not present

## 2023-12-10 LAB — RESP PANEL BY RT-PCR (RSV, FLU A&B, COVID)  RVPGX2
Influenza A by PCR: NEGATIVE
Influenza B by PCR: NEGATIVE
Resp Syncytial Virus by PCR: NEGATIVE
SARS Coronavirus 2 by RT PCR: NEGATIVE

## 2023-12-10 NOTE — ED Triage Notes (Signed)
 C/O cough, fever since yesterday.  Tylenol  taken this morning at 0500.  AAOx3. Skin warm and dry. NAD

## 2023-12-10 NOTE — ED Provider Notes (Signed)
 [facility] Provider Note    Event Date/Time   First MD Initiated Contact with Patient 12/10/23 [phone]     (approximate)   History   Cough   HPI  Christine Chase is a 58 y.o. female with PMH of hypertension presents for evaluation of cough and fever that began yesterday.  Patient states she feels overall unwell.  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      Physical Exam   Triage Vital Signs: ED Triage Vitals  Encounter Vitals Group     BP 12/10/23 0819 (!) 144/95     Girls Systolic BP Percentile --      Girls Diastolic BP Percentile --      Boys Systolic BP Percentile --      Boys Diastolic BP Percentile --      Pulse Rate 12/10/23 0819 94     Resp 12/10/23 0819 17     Temp 12/10/23 0819 98.2 F (36.8 C)     Temp Source 12/10/23 0819 Oral     SpO2 12/10/23 0819 100 %     Weight 12/10/23 0819 167 lb (75.8 kg)     Height 12/10/23 0819 5' 5 (1.651 m)     Head Circumference --      Peak Flow --      Pain Score 12/10/23 0825 0     Pain Loc --      Pain Education --      Exclude from Growth Chart --     Most recent vital signs: Vitals:   12/10/23 0819  BP: (!) 144/95  Pulse: 94  Resp: 17  Temp: 98.2 F (36.8 C)  SpO2: 100%    General: Awake, no distress.  CV:  Good peripheral perfusion.  RRR. Resp:  Normal effort.  CTAB. Abd:  No distention.  Other:     ED Results / Procedures / Treatments   Labs (all labs ordered are listed, but only abnormal results are displayed) Labs Reviewed  RESP PANEL BY RT-PCR (RSV, FLU A&B, COVID)  [id number]    PROCEDURES:  Critical Care performed: No  Procedures   MEDICATIONS ORDERED IN ED: Medications - No data to display   IMPRESSION / MDM / ASSESSMENT AND PLAN / ED COURSE  I reviewed the triage vital signs and the nursing notes.                             58 year old female presents for evaluation of URI symptoms.  Blood pressure is elevated but patient does have  history of hypertension, vital signs stable otherwise.  Differential diagnosis includes, but is not limited to, flu, COVID, RSV, other viral URI considered pneumonia but less likely given stable vital signs.  Patient's presentation is most consistent with acute complicated illness / injury requiring diagnostic workup.  Feel that patient's symptoms are consistent with viral URI.  She was swabbed for flu, COVID and RSV and would like to follow-up with her results on MyChart.  She was given a note for work.  Encouraged symptomatic management.  Discussed return precautions.  She voiced understanding, all questions were answered and she was stable at discharge.      FINAL CLINICAL IMPRESSION(S) / ED DIAGNOSES   Final diagnoses:  Viral URI with cough     Rx / DC Orders   ED Discharge Orders     None  Note:  This document was prepared using Dragon voice recognition software and may include unintentional dictation errors.   Cleaster Tinnie LABOR, PA-C 12/10/23 0848    Arlander [place], MD 12/10/23 [phone]

## 2023-12-10 NOTE — Discharge Instructions (Signed)
 Please follow with the results of your respiratory panel on MyChart. Your symptoms are consistent with a viral illness which will resolve on its own with time.  You do not need an antibiotic.  You can take over-the-counter cold medicine as needed to manage your symptoms.  If you are taking combination cold medicine keep in mind that this often contains Tylenol  so if you need additional medication for body aches or fever control please take Motrin  or ibuprofen .  Your symptoms should resolve with time, if you have had symptoms for greater than 10 days please be evaluated by another healthcare provider as at this point it may have developed into a bacterial infection which requires a different treatment.  Return to the emergency department with worsening symptoms.

## 2024-01-11 ENCOUNTER — Other Ambulatory Visit: Payer: Self-pay | Admitting: Nurse Practitioner

## 2024-01-15 ENCOUNTER — Other Ambulatory Visit: Payer: Self-pay | Admitting: Nurse Practitioner

## 2024-01-15 NOTE — Telephone Encounter (Unsigned)
 Copied from CRM [id number]. Topic: Clinical - Medication Refill >> Jan 15, 2024  8:50 AM Kendralyn S wrote: Medication: metFORMIN  (GLUCOPHAGE ) 500 MG tablet   Plz give a short fill until appt time on jan 27th  Has the patient contacted their pharmacy? Yes (Agent: If no, request that the patient contact the pharmacy for the refill. If patient does not wish to contact the pharmacy document the reason why and proceed with request.) (Agent: If yes, when and what did the pharmacy advise?)  This is the patient's preferred pharmacy:  [id number] - [place], [place] - [address] [place] [address] Phone: [phone] Fax: [phone]   Is this the correct pharmacy for this prescription? Yes If no, delete pharmacy and type the correct one.   Has the prescription been filled recently? Yes  Is the patient out of the medication? Yes  Has the patient been seen for an appointment in the last year OR does the patient have an upcoming appointment? Yes  Can we respond through MyChart? Yes  Agent: Please be advised that Rx refills may take up to 3 business days. We ask that you follow-up with your pharmacy.

## 2024-01-16 MED ORDER — METFORMIN HCL 500 MG PO TABS: 500.0000 mg | ORAL_TABLET | Freq: Two times a day (BID) | ORAL | 0 refills | Status: DC

## 2024-01-16 NOTE — Telephone Encounter (Signed)
 Courtesy refill given, appointment needed.   Requested Prescriptions  Pending Prescriptions Disp Refills   metFORMIN  (GLUCOPHAGE ) 500 MG tablet 60 tablet 0    Sig: Take 1 tablet (500 mg total) by mouth 2 (two) times daily with a meal. Will need visit for further refills.     Endocrinology:  Diabetes - Biguanides Failed - 01/16/2024  3:35 PM      Failed - HBA1C is between 0 and 7.9 and within 180 days    Hgb A1c MFr Bld  Date Value Ref Range Status  08/02/2022 5.6 4.8 - 5.6 % Final    Comment:             Prediabetes: 5.7 - 6.4          Diabetes: >6.4          Glycemic control for adults with diabetes: <7.0          Failed - B12 Level in normal range and within 720 days    No results found for: VITAMINB12       Failed - Valid encounter within last 6 months    Recent Outpatient Visits           8 months ago Left hip pain   [place] [facility] Melvin Pao, NP   11 months ago Acute bacterial conjunctivitis of left eye   [place] [facility] [place], [place] T, NP              Failed - CBC within normal limits and completed in the last 12 months    WBC  Date Value Ref Range Status  07/24/2023 9.9 4.0 - 10.5 K/uL Final   RBC  Date Value Ref Range Status  07/24/2023 5.06 3.87 - 5.11 MIL/uL Final   Hemoglobin  Date Value Ref Range Status  07/24/2023 14.6 12.0 - 15.0 g/dL Final  92/75/7975 84.9 11.1 - 15.9 g/dL Final   HCT  Date Value Ref Range Status  07/24/2023 45.1 36.0 - 46.0 % Final   Hematocrit  Date Value Ref Range Status  08/02/2022 44.6 34.0 - 46.6 % Final   MCHC  Date Value Ref Range Status  07/24/2023 32.4 30.0 - 36.0 g/dL Final   [facility]  Date Value Ref Range Status  07/24/2023 28.9 26.0 - 34.0 pg Final   MCV  Date Value Ref Range Status  07/24/2023 89.1 80.0 - 100.0 fL Final  08/02/2022 88 79 - 97 fL Final  12/13/2011 76 (L) 80 - 100 fL Final   No results found for: PLTCOUNTKUC, LABPLAT, POCPLA RDW   Date Value Ref Range Status  07/24/2023 12.6 11.5 - 15.5 % Final  08/02/2022 12.4 11.7 - 15.4 % Final  12/13/2011 15.1 (H) 11.5 - 14.5 % Final         Passed - Cr in normal range and within 360 days    Creatinine  Date Value Ref Range Status  12/13/2011 0.70 0.60 - 1.30 mg/dL Final   Creatinine, Ser  Date Value Ref Range Status  07/24/2023 0.73 0.44 - 1.00 mg/dL Final         Passed - eGFR in normal range and within 360 days    EGFR (African American)  Date Value Ref Range Status  12/13/2011 >60  Final   GFR calc Af Amer  Date Value Ref Range Status  10/02/2016 >60 >60 mL/min Final    Comment:    (NOTE) The eGFR has been calculated using the CKD EPI equation.  This calculation has not been validated in all clinical situations. eGFR's persistently <60 mL/min signify possible Chronic Kidney Disease.    EGFR (Non-African Amer.)  Date Value Ref Range Status  12/13/2011 >60  Final    Comment:    eGFR values <34mL/min/1.73 m2 may be an indication of chronic kidney disease (CKD). Calculated eGFR is useful in patients with stable renal function. The eGFR calculation will not be reliable in acutely ill patients when serum creatinine is changing rapidly. It is not useful in  patients on dialysis. The eGFR calculation may not be applicable to patients at the low and high extremes of body sizes, pregnant women, and vegetarians.    GFR, Estimated  Date Value Ref Range Status  07/24/2023 >60 >60 mL/min Final    Comment:    (NOTE) Calculated using the CKD-EPI Creatinine Equation (2021)    eGFR  Date Value Ref Range Status  08/02/2022 93 >59 mL/min/1.73 Final

## 2024-02-02 NOTE — Patient Instructions (Signed)

## 2024-02-05 ENCOUNTER — Ambulatory Visit (INDEPENDENT_AMBULATORY_CARE_PROVIDER_SITE_OTHER): Admitting: Nurse Practitioner

## 2024-02-05 ENCOUNTER — Encounter: Payer: Self-pay | Admitting: Nurse Practitioner

## 2024-02-05 VITALS — BP 133/76 | HR 72 | Temp 97.9°F | Ht 65.0 in | Wt 178.6 lb

## 2024-02-05 DIAGNOSIS — R7301 Impaired fasting glucose: Secondary | ICD-10-CM | POA: Diagnosis not present

## 2024-02-05 DIAGNOSIS — E7800 Pure hypercholesterolemia, unspecified: Secondary | ICD-10-CM | POA: Diagnosis not present

## 2024-02-05 DIAGNOSIS — R059 Cough, unspecified: Secondary | ICD-10-CM | POA: Insufficient documentation

## 2024-02-05 DIAGNOSIS — R052 Subacute cough: Secondary | ICD-10-CM

## 2024-02-05 DIAGNOSIS — F4320 Adjustment disorder, unspecified: Secondary | ICD-10-CM | POA: Diagnosis not present

## 2024-02-05 DIAGNOSIS — Z6828 Body mass index (BMI) 28.0-28.9, adult: Secondary | ICD-10-CM

## 2024-02-05 DIAGNOSIS — I10 Essential (primary) hypertension: Secondary | ICD-10-CM

## 2024-02-05 LAB — MICROALBUMIN, URINE WAIVED
Creatinine, Urine Waived: 200 mg/dL (ref 10–300)
Microalb, Ur Waived: 30 mg/L — ABNORMAL HIGH (ref 0–19)
Microalb/Creat Ratio: <30 mg/g

## 2024-02-05 MED ORDER — METFORMIN HCL 500 MG PO TABS: 500.0000 mg | ORAL_TABLET | Freq: Two times a day (BID) | ORAL | 2 refills | Status: AC

## 2024-02-05 MED ORDER — LISINOPRIL 20 MG PO TABS: 20.0000 mg | ORAL_TABLET | Freq: Every day | ORAL | 2 refills | Status: AC

## 2024-02-05 MED ORDER — LORAZEPAM 0.5 MG PO TABS: 0.5000 mg | ORAL_TABLET | Freq: Every day | ORAL | 0 refills | Status: AC | PRN

## 2024-02-05 MED ORDER — AZITHROMYCIN 250 MG PO TABS: ORAL_TABLET | ORAL | 0 refills | Status: AC

## 2024-02-05 NOTE — Assessment & Plan Note (Signed)
 Noted on past labs.  Check lipid panel today.  ASCVD 4.5%.  Continue diet and exercise focus, initiate medication as needed.

## 2024-02-05 NOTE — Assessment & Plan Note (Signed)
 BMI 29.72.  She would like to lose 30 pounds.  Will continue Metformin  500 MG BID due to IFG and weight loss desire. Educated her on this and side effects.  Could also consider Contrave or Topamax in future. Her insurance told her she needs to try 3 other medications and fail before she can get injectables.  Recommended eating smaller high protein, low fat meals more frequently and exercising 30 mins a day 5 times a week with a goal of 10-15lb weight loss in the next 3 months. Patient voiced their understanding and motivation to adhere to these recommendations.

## 2024-02-05 NOTE — Progress Notes (Signed)
 "  BP 133/76   Pulse 72   Temp 97.9 F (36.6 C) (Oral)   Ht 5' 5 (1.651 m)   Wt 178 lb 9.6 oz (81 kg)   LMP 08/24/2018   SpO2 99%   BMI 29.72 kg/m    Subjective:    Patient ID: Christine Chase, female    DOB: 1965/03/29, 59 y.o.   MRN: [id number]  HPI: Christine Chase is a 59 y.o. female  Chief Complaint  Patient presents with   Hypertension   IFG   Cough    Patient states she has been having a dry cough for the last 3 weeks. States she has not been coughing up anything and gets worse at night. States she also notices that she coughs more when she gets hot.    HYPERTENSION Taking Lisinopril  20 MG daily.  Has had no ADR with this.   Hypertension status: stable  Satisfied with current treatment? yes Duration of hypertension: chronic BP monitoring frequency: occasional BP range:  BP medication side effects:  no Medication compliance: good compliance Previous BP meds: as above Aspirin: no Recurrent headaches: no, only if BP trends up Visual changes: no Palpitations: no Dyspnea: no Chest pain: no Lower extremity edema: no Dizzy/lightheaded: no  The 10-year ASCVD risk score (Arnett DK, et al., 2019) is: 4.5%   Values used to calculate the score:     Age: 89 years     Clinically relevant sex: Female     Is Non-Hispanic African American: No     Diabetic: No     Tobacco smoker: No     Systolic Blood Pressure: 133 mmHg     Is BP treated: Yes     HDL Cholesterol: 46 mg/dL     Total Cholesterol: 220 mg/dL  Impaired Fasting Glucose Taking Metformin  500 MG BID. Has helped her with weight loss and maintaining sugar levels. HbA1C:  Lab Results  Component Value Date   HGBA1C 5.6 08/02/2022  Duration of elevated blood sugar:  Polydipsia: no Polyuria: no Weight change: no Visual disturbance: no Glucose Monitoring: no    Accucheck frequency: Not Checking    Fasting glucose:     Post prandial:  Diabetic Education: Not Completed Family history of diabetes: yes    COUGH Has been present for 3 weeks. Was seen on 12/10/23 for this. Tested and no Covid, Flu, or RSV present. Cough is still lingering. Duration: weeks Circumstances of initial development of cough: URI Cough severity: mild Cough description: hacking and dry Aggravating factors:  worse at night Alleviating factors: mucinex  and cough syrup Status:  fluctuating Treatments attempted: Cough syrup Wheezing: no Shortness of breath: no Chest pain: no Chest tightness:no Nasal congestion: yes Runny nose: yes Postnasal drip: yes Frequent throat clearing or swallowing: yes Hemoptysis: no Fevers: no Night sweats: no Weight loss: no Heartburn: no Recent foreign travel: no Tuberculosis contacts: no   DEPRESSION Mother is in final stage of her life with cancer, tumor in back -- incurable. Is on hospice care. Major stressor for her.  Was given Ativan  by another provider in office in October, but has been out for awhile. Did not take every day. Mood status: exacerbated Satisfied with current treatment?: yes Symptom severity: moderate  Duration of current treatment : chronic Side effects: no Medication compliance: good compliance Depressed mood: no Anxious mood: yes Anhedonia: no Significant weight loss or gain: no Insomnia: none Fatigue: no Feelings of worthlessness or guilt: no Impaired concentration/indecisiveness: no Suicidal ideations: no Hopelessness: no Crying  spells: yes    02/05/2024    3:16 PM 05/01/2023    1:51 PM 01/16/2023    2:55 PM 11/13/2022    2:40 PM 10/20/2022   10:27 AM  Depression screen PHQ 2/9  Decreased Interest 0 0 0 0 0  Down, Depressed, Hopeless 0 1 0 0 1  PHQ - 2 Score 0 1 0 0 1  Altered sleeping 0 0 0 0 1  Tired, decreased energy 0 0 0 0 0  Change in appetite 0 0 0 0 0  Feeling bad or failure about yourself  0 0 0 0 0  Trouble concentrating 0 1 0 0 0  Moving slowly or fidgety/restless 0 0 0 0 0  Suicidal thoughts 0 0 0 0 0  PHQ-9 Score 0 2  0  0   2   Difficult doing work/chores Not difficult at all  Not difficult at all Not difficult at all Not difficult at all     Data saved with a previous flowsheet row definition       02/05/2024    3:17 PM 05/01/2023    1:51 PM 01/16/2023    2:56 PM 10/20/2022   10:27 AM  GAD 7 : Generalized Anxiety Score  Nervous, Anxious, on Edge 1 2  0  1   Control/stop worrying 1 0  1  1   Worry too much - different things 1 2  1   0   Trouble relaxing 0 0  1  1   Restless 0 0  0  1   Easily annoyed or irritable 0 2  0  0   Afraid - awful might happen 1 2  1   0   Total GAD 7 Score 4 8 4 4   Anxiety Difficulty Not difficult at all Somewhat difficult Not difficult at all Not difficult at all     Data saved with a previous flowsheet row definition   Relevant past medical, surgical, family and social history reviewed and updated as indicated. Interim medical history since our last visit reviewed. Allergies and medications reviewed and updated.  Review of Systems  Constitutional:  Negative for activity change, appetite change, diaphoresis, fatigue and fever.  Respiratory:  Negative for cough, chest tightness, shortness of breath and wheezing.   Cardiovascular:  Negative for chest pain, palpitations and leg swelling.  Gastrointestinal: Negative.   Endocrine: Negative for polydipsia, polyphagia and polyuria.  Neurological: Negative.   Psychiatric/Behavioral:  Negative for decreased concentration, self-injury, sleep disturbance and suicidal ideas. The patient is nervous/anxious.     Per HPI unless specifically indicated above     Objective:    BP 133/76   Pulse 72   Temp 97.9 F (36.6 C) (Oral)   Ht 5' 5 (1.651 m)   Wt 178 lb 9.6 oz (81 kg)   LMP 08/24/2018   SpO2 99%   BMI 29.72 kg/m   Wt Readings from Last 3 Encounters:  02/05/24 178 lb 9.6 oz (81 kg)  12/10/23 167 lb (75.8 kg)  05/01/23 173 lb 9.6 oz (78.7 kg)    Physical Exam Vitals and nursing note reviewed.  Constitutional:       General: She is awake. She is not in acute distress.    Appearance: She is well-developed, well-groomed and overweight. She is not ill-appearing or toxic-appearing.  HENT:     Head: Normocephalic.     Right Ear: Hearing and external ear normal.     Left Ear: Hearing and external ear normal.  Eyes:     General: Lids are normal.        Right eye: No discharge.        Left eye: No discharge.     Conjunctiva/sclera: Conjunctivae normal.     Pupils: Pupils are equal, round, and reactive to light.  Neck:     Thyroid: No thyromegaly.     Vascular: No carotid bruit.  Cardiovascular:     Rate and Rhythm: Normal rate and regular rhythm.     Heart sounds: Normal heart sounds. No murmur heard.    No gallop.  Pulmonary:     Effort: Pulmonary effort is normal. No accessory muscle usage or respiratory distress.     Breath sounds: Normal breath sounds. No decreased breath sounds, wheezing or rales.  Abdominal:     General: Bowel sounds are normal. There is no distension.     Palpations: Abdomen is soft.     Tenderness: There is no abdominal tenderness.  Musculoskeletal:     Cervical back: Normal range of motion and neck supple.     Right lower leg: No edema.     Left lower leg: No edema.  Lymphadenopathy:     Cervical: No cervical adenopathy.  Skin:    General: Skin is warm and dry.  Neurological:     Mental Status: She is alert and oriented to person, place, and time.     Deep Tendon Reflexes: Reflexes are normal and symmetric.     Reflex Scores:      Brachioradialis reflexes are 2+ on the right side and 2+ on the left side.      Patellar reflexes are 2+ on the right side and 2+ on the left side. Psychiatric:        Attention and Perception: Attention normal.        Mood and Affect: Mood normal.        Speech: Speech normal.        Behavior: Behavior normal. Behavior is cooperative.        Thought Content: Thought content normal.    Results for orders placed or performed during the  hospital encounter of 12/10/23  Resp panel by RT-PCR (RSV, Flu A&B, Covid) Anterior Nasal Swab   Collection Time: 12/10/23  8:27 AM   Specimen: Anterior Nasal Swab  Result Value Ref Range   SARS Coronavirus 2 by RT PCR NEGATIVE NEGATIVE   Influenza A by PCR NEGATIVE NEGATIVE   Influenza B by PCR NEGATIVE NEGATIVE   Resp Syncytial Virus by PCR NEGATIVE NEGATIVE      Assessment & Plan:   Problem List Items Addressed This Visit       Cardiovascular and Mediastinum   Essential hypertension - Primary   Chronic, stable.  BP at goal in office and at home.  Recommend she monitor BP at least a few mornings a week at home and document.  DASH diet at home.  Continue current medication regimen and adjust as needed, refills sent.  LABS: CBC, CMP, TSH, urine ALB.  Urine ALB 08 February 2023, continue ACE for kidney protection.          Relevant Medications   lisinopril  (ZESTRIL ) 20 MG tablet   Other Relevant Orders   CBC with Differential/Platelet   TSH   Microalbumin, Urine Waived     Endocrine   IFG (impaired fasting glucose)   Noted on past labs with glucose 114 to 120, but last A1c 5.6%.  Check A1c today.  Will continue Metformin   to assist with weight loss and prediabetes, is offering benefit.  Educated her on this medication and side effects.      Relevant Orders   HgB A1c     Other   Elevated low density lipoprotein (LDL) cholesterol level   Noted on past labs.  Check lipid panel today.  ASCVD 4.5%.  Continue diet and exercise focus, initiate medication as needed.      Relevant Orders   Comprehensive metabolic panel with GFR   Lipid Panel w/o Chol/HDL Ratio   Cough   Ongoing since beginning of December when UTI present. Will send in Zpack for treatment and recommend se take daily Zyrtec for next 14 days to help with nasal drainage, suspect this is exacerbating cough. Continue cough medication at home. Lungs overall clear on exam today. If ongoing she is to return to office.  Recommend: - Increased rest - Increasing Fluids - Acetaminophen  / ibuprofen  as needed for fever/pain.  - Salt water gargling, chloraseptic spray and throat lozenges - Mucinex .  - Humidifying the air.       BMI 28.0-28.9,adult   BMI 29.72.  She would like to lose 30 pounds.  Will continue Metformin  500 MG BID due to IFG and weight loss desire. Educated her on this and side effects.  Could also consider Contrave or Topamax in future. Her insurance told her she needs to try 3 other medications and fail before she can get injectables.  Recommended eating smaller high protein, low fat meals more frequently and exercising 30 mins a day 5 times a week with a goal of 10-15lb weight loss in the next 3 months. Patient voiced their understanding and motivation to adhere to these recommendations.       Anticipatory grief   Exacerbated due to her mother's terminal cancer diagnosis.  Will maintain Ativan  PRN for now, however she is aware to use sparsely for severe anxiety only and that this is not for long term use.  30 pills should last at least 3 months or more.  Discussed at length with patient.  Refills sent.        Follow up plan: Return in about 6 months (around 08/04/2024) for HTN/HLD, Depression, IFG.      "

## 2024-02-05 NOTE — Assessment & Plan Note (Signed)
 Chronic, stable.  BP at goal in office and at home.  Recommend she monitor BP at least a few mornings a week at home and document.  DASH diet at home.  Continue current medication regimen and adjust as needed, refills sent.  LABS: CBC, CMP, TSH, urine ALB.  Urine ALB 08 February 2023, continue ACE for kidney protection.

## 2024-02-05 NOTE — Assessment & Plan Note (Signed)
 Noted on past labs with glucose 114 to 120, but last A1c 5.6%.  Check A1c today.  Will continue Metformin  to assist with weight loss and prediabetes, is offering benefit.  Educated her on this medication and side effects.

## 2024-02-05 NOTE — Assessment & Plan Note (Signed)
 Ongoing since beginning of December when UTI present. Will send in Zpack for treatment and recommend se take daily Zyrtec for next 14 days to help with nasal drainage, suspect this is exacerbating cough. Continue cough medication at home. Lungs overall clear on exam today. If ongoing she is to return to office. Recommend: - Increased rest - Increasing Fluids - Acetaminophen  / ibuprofen  as needed for fever/pain.  - Salt water gargling, chloraseptic spray and throat lozenges - Mucinex .  - Humidifying the air.

## 2024-02-05 NOTE — Assessment & Plan Note (Signed)
 Exacerbated due to her mother's terminal cancer diagnosis.  Will maintain Ativan  PRN for now, however she is aware to use sparsely for severe anxiety only and that this is not for long term use.  30 pills should last at least 3 months or more.  Discussed at length with patient.  Refills sent.

## 2024-02-06 ENCOUNTER — Ambulatory Visit: Payer: Self-pay | Admitting: Nurse Practitioner

## 2024-02-06 LAB — COMPREHENSIVE METABOLIC PANEL WITH GFR
ALT: 18 [IU]/L (ref 0–32)
AST: 15 [IU]/L (ref 0–40)
Albumin: 4.5 g/dL (ref 3.8–4.9)
Alkaline Phosphatase: 79 [IU]/L (ref 49–135)
BUN/Creatinine Ratio: 27 — ABNORMAL HIGH (ref 9–23)
BUN: 18 mg/dL (ref 6–24)
Bilirubin Total: 0.3 mg/dL (ref 0.0–1.2)
CO2: 20 mmol/L (ref 20–29)
Calcium: 9.3 mg/dL (ref 8.7–10.2)
Chloride: 101 mmol/L (ref 96–106)
Creatinine, Ser: 0.66 mg/dL (ref 0.57–1.00)
Globulin, Total: 2 g/dL (ref 1.5–4.5)
Glucose: 78 mg/dL (ref 70–99)
Potassium: 4.4 mmol/L (ref 3.5–5.2)
Sodium: 139 mmol/L (ref 134–144)
Total Protein: 6.5 g/dL (ref 6.0–8.5)
eGFR: 102 mL/min/{1.73_m2}

## 2024-02-06 LAB — HEMOGLOBIN A1C
Est. average glucose Bld gHb Est-mCnc: 108 mg/dL
Hgb A1c MFr Bld: 5.4 % (ref 4.8–5.6)

## 2024-02-06 LAB — CBC WITH DIFFERENTIAL/PLATELET
Basophils Absolute: 0.1 10*3/uL (ref 0.0–0.2)
Basos: 1 %
EOS (ABSOLUTE): 0.4 10*3/uL (ref 0.0–0.4)
Eos: 5 %
Hematocrit: 43.8 % (ref 34.0–46.6)
Hemoglobin: 14.3 g/dL (ref 11.1–15.9)
Immature Grans (Abs): 0 10*3/uL (ref 0.0–0.1)
Immature Granulocytes: 0 %
Lymphocytes Absolute: 3.3 10*3/uL — ABNORMAL HIGH (ref 0.7–3.1)
Lymphs: 36 %
MCH: 28.4 pg (ref 26.6–33.0)
MCHC: 32.6 g/dL (ref 31.5–35.7)
MCV: 87 fL (ref 79–97)
Monocytes Absolute: 0.7 10*3/uL (ref 0.1–0.9)
Monocytes: 8 %
Neutrophils Absolute: 4.5 10*3/uL (ref 1.4–7.0)
Neutrophils: 50 %
Platelets: 430 10*3/uL (ref 150–450)
RBC: 5.04 x10E6/uL (ref 3.77–5.28)
RDW: 12.4 % (ref 11.7–15.4)
WBC: 8.9 10*3/uL (ref 3.4–10.8)

## 2024-02-06 LAB — LIPID PANEL W/O CHOL/HDL RATIO
Cholesterol, Total: 233 mg/dL — ABNORMAL HIGH (ref 100–199)
HDL: 53 mg/dL
LDL Chol Calc (NIH): 152 mg/dL — ABNORMAL HIGH (ref 0–99)
Triglycerides: 153 mg/dL — ABNORMAL HIGH (ref 0–149)
VLDL Cholesterol Cal: 28 mg/dL (ref 5–40)

## 2024-02-06 LAB — TSH: TSH: 1.55 u[IU]/mL (ref 0.450–4.500)

## 2024-02-06 NOTE — Progress Notes (Signed)
 Contacted via MyChart The 10-year ASCVD risk score (Arnett DK, et al., 2019) is: 4.2%   Values used to calculate the score:     Age: 59 years     Clinically relevant sex: Female     Is Non-Hispanic African American: No     Diabetic: No     Tobacco smoker: No     Systolic Blood Pressure: 133 mmHg     Is BP treated: Yes     HDL Cholesterol: 53 mg/dL     Total Cholesterol: 233 mg/dL  Good morning Christine Chase, your labs have returned and are overall stable with exception of lipid panel. Your cholesterol is high, but recommendations to make lifestyle changes. Your LDL is above normal.  The LDL is the bad cholesterol.  Over time and in combination with inflammation and other factors, this contributes to plaque which in turn may lead to stroke and/or heart attack down the road.  Sometimes high LDL is primarily genetic, and people might be eating all the right foods but still have high numbers.  Other times, there is room for improvement in one's diet and eating healthier can bring this number down and potentially reduce one's risk of heart attack and/or stroke. To reduce your LDL, Remember - more fruits and vegetables, more fish, and limit red meat and dairy products.  More soy, nuts, beans, barley, lentils, oats and plant sterol ester enriched margarine instead of butter.  I also encourage eliminating sugar and processed food.  Remember, shop on the outside of the grocery store and visit your [employer]. Any questions? Keep being amazing!!  Thank you for allowing me to participate in your care.  I appreciate you. Kindest regards, Zakk Borgen

## 2024-08-04 ENCOUNTER — Ambulatory Visit: Admitting: Nurse Practitioner
